# Patient Record
Sex: Male | Born: 1970 | Race: White | Hispanic: No | Marital: Married | Smoking: Never smoker
Health system: Southern US, Community
[De-identification: ages and names within clinical notes are randomized; demographics above are authoritative.]

## PROBLEM LIST (undated history)

## (undated) DIAGNOSIS — G4730 Sleep apnea, unspecified: Secondary | ICD-10-CM

## (undated) DIAGNOSIS — R51 Headache: Secondary | ICD-10-CM

## (undated) DIAGNOSIS — F419 Anxiety disorder, unspecified: Secondary | ICD-10-CM

## (undated) DIAGNOSIS — R6889 Other general symptoms and signs: Secondary | ICD-10-CM

---

## 1998-10-31 HISTORY — PX: ELBOW ARTHROSCOPY: SUR87

## 2000-10-31 HISTORY — PX: CHOLECYSTECTOMY: SHX55

## 2013-06-04 DIAGNOSIS — M79606 Pain in leg, unspecified: Secondary | ICD-10-CM | POA: Insufficient documentation

## 2013-06-18 DIAGNOSIS — M5431 Sciatica, right side: Secondary | ICD-10-CM | POA: Insufficient documentation

## 2013-06-21 DIAGNOSIS — R29898 Other symptoms and signs involving the musculoskeletal system: Secondary | ICD-10-CM | POA: Insufficient documentation

## 2013-06-21 DIAGNOSIS — M5416 Radiculopathy, lumbar region: Secondary | ICD-10-CM | POA: Insufficient documentation

## 2013-06-28 DIAGNOSIS — M48061 Spinal stenosis, lumbar region without neurogenic claudication: Secondary | ICD-10-CM | POA: Insufficient documentation

## 2013-06-28 DIAGNOSIS — R270 Ataxia, unspecified: Secondary | ICD-10-CM | POA: Insufficient documentation

## 2013-07-04 DIAGNOSIS — M4802 Spinal stenosis, cervical region: Secondary | ICD-10-CM | POA: Insufficient documentation

## 2013-07-04 DIAGNOSIS — M4804 Spinal stenosis, thoracic region: Secondary | ICD-10-CM | POA: Insufficient documentation

## 2013-10-10 ENCOUNTER — Inpatient Hospital Stay (HOSPITAL_COMMUNITY): Admission: RE | Admit: 2013-10-10 | Payer: Self-pay | Source: Ambulatory Visit | Admitting: Neurological Surgery

## 2013-10-10 ENCOUNTER — Encounter (HOSPITAL_COMMUNITY): Admission: RE | Payer: Self-pay | Source: Ambulatory Visit

## 2013-10-10 SURGERY — LUMBAR LAMINECTOMY/DECOMPRESSION MICRODISCECTOMY 4 LEVEL
Anesthesia: General

## 2014-01-06 ENCOUNTER — Other Ambulatory Visit: Payer: Self-pay | Admitting: Neurological Surgery

## 2014-02-12 ENCOUNTER — Ambulatory Visit (HOSPITAL_COMMUNITY)
Admission: RE | Admit: 2014-02-12 | Discharge: 2014-02-12 | Disposition: A | Discharge status: Home / Self Care | Payer: Worker's Compensation | Source: Ambulatory Visit | Attending: Neurological Surgery | Admitting: Neurological Surgery

## 2014-02-12 ENCOUNTER — Encounter (HOSPITAL_COMMUNITY)
Admission: RE | Admit: 2014-02-12 | Discharge: 2014-02-12 | Disposition: A | Discharge status: Home / Self Care | Payer: Worker's Compensation | Source: Ambulatory Visit | Attending: Neurological Surgery | Admitting: Neurological Surgery

## 2014-02-12 ENCOUNTER — Encounter (HOSPITAL_COMMUNITY): Payer: Self-pay

## 2014-02-12 ENCOUNTER — Encounter (HOSPITAL_COMMUNITY): Payer: Self-pay | Admitting: Pharmacy Technician

## 2014-02-12 DIAGNOSIS — Z01818 Encounter for other preprocedural examination: Secondary | ICD-10-CM | POA: Diagnosis present

## 2014-02-12 DIAGNOSIS — Z01810 Encounter for preprocedural cardiovascular examination: Secondary | ICD-10-CM | POA: Insufficient documentation

## 2014-02-12 LAB — CBC WITH DIFFERENTIAL/PLATELET
Basophils Absolute: 0 10*3/uL (ref 0.0–0.1)
Basophils Relative: 0 % (ref 0–1)
Eosinophils Absolute: 0.2 10*3/uL (ref 0.0–0.7)
Eosinophils Relative: 1 % (ref 0–5)
HCT: 47.2 % (ref 39.0–52.0)
HEMOGLOBIN: 16.5 g/dL (ref 13.0–17.0)
LYMPHS ABS: 4.1 10*3/uL — AB (ref 0.7–4.0)
Lymphocytes Relative: 30 % (ref 12–46)
MCH: 31.9 pg (ref 26.0–34.0)
MCHC: 35 g/dL (ref 30.0–36.0)
MCV: 91.3 fL (ref 78.0–100.0)
MONOS PCT: 7 % (ref 3–12)
Monocytes Absolute: 0.9 10*3/uL (ref 0.1–1.0)
NEUTROS ABS: 8.5 10*3/uL — AB (ref 1.7–7.7)
Neutrophils Relative %: 62 % (ref 43–77)
Platelets: 228 10*3/uL (ref 150–400)
RBC: 5.17 MIL/uL (ref 4.22–5.81)
RDW: 12.8 % (ref 11.5–15.5)
WBC: 13.8 10*3/uL — ABNORMAL HIGH (ref 4.0–10.5)

## 2014-02-12 LAB — BASIC METABOLIC PANEL
BUN: 13 mg/dL (ref 6–23)
CO2: 22 meq/L (ref 19–32)
Calcium: 9.6 mg/dL (ref 8.4–10.5)
Chloride: 103 mEq/L (ref 96–112)
Creatinine, Ser: 0.85 mg/dL (ref 0.50–1.35)
GFR calc Af Amer: >90 mL/min (ref >90)
GLUCOSE: 94 mg/dL (ref 70–99)
POTASSIUM: 4.2 meq/L (ref 3.7–5.3)
SODIUM: 140 meq/L (ref 137–147)

## 2014-02-12 LAB — PROTIME-INR
INR: 0.98 (ref 0.00–1.49)
Prothrombin Time: 12.8 seconds (ref 11.6–15.2)

## 2014-02-12 LAB — SURGICAL PCR SCREEN
MRSA, PCR: NEGATIVE
STAPHYLOCOCCUS AUREUS: NEGATIVE

## 2014-02-12 NOTE — Progress Notes (Signed)
02/12/14 1343  OBSTRUCTIVE SLEEP APNEA  Have you ever been diagnosed with sleep apnea through a sleep study? No  Do you snore loudly (loud enough to be heard through closed doors)?  1  Do you often feel tired, fatigued, or sleepy during the daytime? 1  Has anyone observed you stop breathing during your sleep? 1  Do you have, or are you being treated for high blood pressure? 0  BMI more than 35 kg/m2? 1  Age over 42 years old? 0  Neck circumference greater than 40 cm/18 inches? 1  Gender: 1  Obstructive Sleep Apnea Score 6  Score 4 or greater  Results sent to PCP

## 2014-02-12 NOTE — Pre-Procedure Instructions (Signed)
Raymond [name]  02/12/2014   Your procedure is scheduled on:  02/20/2014  [facility]  2 * 3  Entrance A, proceed to admitting office at 5:30 AM.  Call this number if you have problems the morning of surgery: [phone]   Remember:   Do not eat food or drink liquids after midnight. On Wednesday EVENING  Take these medicines the morning of surgery with A SIP OF WATER: NONE   Do not wear jewelry  Do not wear lotions, powders, or perfumes. You may wear deodorant.   Men may shave face and neck.  Do not bring valuables to the hospital.  [facility] not responsible                  for any belongings or valuables.               Contacts, dentures or bridgework may not be worn into surgery.  Leave suitcase in the car. After surgery it may be brought to your room.  For patients admitted to the hospital, discharge time is determined by your                treatment team.               Patients discharged the day of surgery will not be allowed to drive  home.  Name and phone number of your driver: with wife  Special Instructions: Special Instructions: [facility] - Preparing for Surgery  Before surgery, you can play an important role.  Because skin is not sterile, your skin needs to be as free of germs as possible.  You can reduce the number of germs on you skin by washing with CHG (chlorahexidine gluconate) soap before surgery.  CHG is an antiseptic cleaner which kills germs and bonds with the skin to continue killing germs even after washing.  Please DO NOT use if you have an allergy to CHG or antibacterial soaps.  If your skin becomes reddened/irritated stop using the CHG and inform your nurse when you arrive at Short Stay.  Do not shave (including legs and underarms) for at least 48 hours prior to the first CHG shower.  You may shave your face.  Please follow these instructions carefully:   1.  Shower with CHG Soap the night before surgery and the   morning of Surgery.  2.  If you choose to wash your hair, wash your hair first as usual with your  normal shampoo.  3.  After you shampoo, rinse your hair and body thoroughly to remove the  Shampoo.  4.  Use CHG as you would any other liquid soap.  You can apply chg directly to the skin and wash gently with scrungie or a clean washcloth.  5.  Apply the CHG Soap to your body ONLY FROM THE NECK DOWN.    Do not use on open wounds or open sores.  Avoid contact with your eyes, ears, mouth and genitals (private parts).  Wash genitals (private parts)   with your normal soap.  6.  Wash thoroughly, paying special attention to the area where your surgery will be performed.  7.  Thoroughly rinse your body with warm water from the neck down.  8.  DO NOT shower/wash with your normal soap after using and rinsing off   the CHG Soap.  9.  Pat yourself dry with a clean towel.  10.  Wear clean pajamas.            11.  Place clean sheets on your bed the night of your first shower and do not sleep with pets.  Day of Surgery  Do not apply any lotions/deodorants the morning of surgery.  Please wear clean clothes to the hospital/surgery center.   Please read over the following fact sheets that you were given: Pain Booklet, Coughing and Deep Breathing, MRSA Information and Surgical Site Infection Prevention

## 2014-02-12 NOTE — Progress Notes (Signed)
Pt. Recalls a possible EKG in past, unsure where it was done.  Pt. Denies ever seeing Pulm. Or cardiac specialty

## 2014-02-19 MED ORDER — DEXTROSE 5 % IV SOLN
3.0000 g | Freq: Once | INTRAVENOUS | Status: AC
  Administered 2014-02-20: 3 g via INTRAVENOUS
  Filled 2014-02-19: qty 3000

## 2014-02-19 MED ORDER — DEXAMETHASONE SODIUM PHOSPHATE 10 MG/ML IJ SOLN
10.0000 mg | INTRAMUSCULAR | Status: AC
  Administered 2014-02-20: 10 mg via INTRAVENOUS
  Filled 2014-02-19: qty 1

## 2014-02-19 MED ORDER — CEFAZOLIN SODIUM-DEXTROSE 2-3 GM-% IV SOLR: 2.0000 g | INTRAVENOUS | Status: DC

## 2014-02-20 ENCOUNTER — Ambulatory Visit (HOSPITAL_COMMUNITY)
Admission: RE | Admit: 2014-02-20 | Discharge: 2014-02-21 | Disposition: A | Discharge status: Home / Self Care | Payer: Worker's Compensation | Source: Ambulatory Visit | Attending: Neurological Surgery | Admitting: Neurological Surgery

## 2014-02-20 ENCOUNTER — Encounter (HOSPITAL_COMMUNITY): Payer: Self-pay | Admitting: *Deleted

## 2014-02-20 ENCOUNTER — Inpatient Hospital Stay (HOSPITAL_COMMUNITY): Payer: Worker's Compensation | Admitting: Anesthesiology

## 2014-02-20 ENCOUNTER — Encounter (HOSPITAL_COMMUNITY)
Admission: RE | Disposition: A | Discharge status: Home / Self Care | Payer: Self-pay | Source: Ambulatory Visit | Attending: Neurological Surgery

## 2014-02-20 ENCOUNTER — Inpatient Hospital Stay (HOSPITAL_COMMUNITY): Payer: Worker's Compensation

## 2014-02-20 ENCOUNTER — Encounter (HOSPITAL_COMMUNITY): Payer: Worker's Compensation | Admitting: Anesthesiology

## 2014-02-20 DIAGNOSIS — F411 Generalized anxiety disorder: Secondary | ICD-10-CM | POA: Insufficient documentation

## 2014-02-20 DIAGNOSIS — Z9889 Other specified postprocedural states: Secondary | ICD-10-CM

## 2014-02-20 DIAGNOSIS — X58XXXA Exposure to other specified factors, initial encounter: Secondary | ICD-10-CM | POA: Insufficient documentation

## 2014-02-20 DIAGNOSIS — Y990 Civilian activity done for income or pay: Secondary | ICD-10-CM | POA: Insufficient documentation

## 2014-02-20 DIAGNOSIS — M216X9 Other acquired deformities of unspecified foot: Secondary | ICD-10-CM | POA: Insufficient documentation

## 2014-02-20 DIAGNOSIS — S33101A Dislocation of unspecified lumbar vertebra, initial encounter: Principal | ICD-10-CM | POA: Insufficient documentation

## 2014-02-20 HISTORY — DX: Anxiety disorder, unspecified: F41.9

## 2014-02-20 HISTORY — PX: LUMBAR LAMINECTOMY/DECOMPRESSION MICRODISCECTOMY: SHX5026

## 2014-02-20 HISTORY — DX: Headache: R51

## 2014-02-20 HISTORY — DX: Other general symptoms and signs: R68.89

## 2014-02-20 SURGERY — LUMBAR LAMINECTOMY/DECOMPRESSION MICRODISCECTOMY 4 LEVEL
Anesthesia: General | Site: Back

## 2014-02-20 MED ORDER — OXYCODONE HCL 5 MG/5ML PO SOLN: 5.0000 mg | Freq: Once | ORAL | Status: DC | PRN

## 2014-02-20 MED ORDER — SODIUM CHLORIDE 0.9 % IJ SOLN: 3.0000 mL | INTRAMUSCULAR | Status: DC | PRN

## 2014-02-20 MED ORDER — HYDROMORPHONE HCL PF 1 MG/ML IJ SOLN: 0.2500 mg | INTRAMUSCULAR | Status: DC | PRN

## 2014-02-20 MED ORDER — MIDAZOLAM HCL 2 MG/2ML IJ SOLN
INTRAMUSCULAR | Status: AC
  Filled 2014-02-20: qty 2

## 2014-02-20 MED ORDER — PROPOFOL 10 MG/ML IV BOLUS
INTRAVENOUS | Status: AC
  Filled 2014-02-20: qty 20

## 2014-02-20 MED ORDER — ACETAMINOPHEN 650 MG RE SUPP: 650.0000 mg | RECTAL | Status: DC | PRN

## 2014-02-20 MED ORDER — NEOSTIGMINE METHYLSULFATE 1 MG/ML IJ SOLN
INTRAMUSCULAR | Status: AC
  Filled 2014-02-20: qty 10

## 2014-02-20 MED ORDER — 0.9 % SODIUM CHLORIDE (POUR BTL) OPTIME
TOPICAL | Status: DC | PRN
  Administered 2014-02-20: 1000 mL

## 2014-02-20 MED ORDER — PROPOFOL 10 MG/ML IV BOLUS
INTRAVENOUS | Status: DC | PRN
  Administered 2014-02-20: 300 mg via INTRAVENOUS

## 2014-02-20 MED ORDER — THROMBIN 5000 UNITS EX SOLR
CUTANEOUS | Status: DC | PRN
Start: 2014-02-20 — End: 2014-02-20
  Administered 2014-02-20: 09:00:00 via TOPICAL

## 2014-02-20 MED ORDER — BUPIVACAINE HCL (PF) 0.25 % IJ SOLN
INTRAMUSCULAR | Status: DC | PRN
  Administered 2014-02-20: 8 mL

## 2014-02-20 MED ORDER — GLYCOPYRROLATE 0.2 MG/ML IJ SOLN
INTRAMUSCULAR | Status: DC | PRN
  Administered 2014-02-20: .8 mg via INTRAVENOUS

## 2014-02-20 MED ORDER — FENTANYL CITRATE 0.05 MG/ML IJ SOLN
INTRAMUSCULAR | Status: DC | PRN
  Administered 2014-02-20 (×5): 50 ug via INTRAVENOUS

## 2014-02-20 MED ORDER — PROMETHAZINE HCL 25 MG/ML IJ SOLN: 6.2500 mg | INTRAMUSCULAR | Status: DC | PRN

## 2014-02-20 MED ORDER — METHOCARBAMOL 500 MG PO TABS
ORAL_TABLET | ORAL | Status: AC
  Filled 2014-02-20: qty 1

## 2014-02-20 MED ORDER — ACETAMINOPHEN 10 MG/ML IV SOLN
INTRAVENOUS | Status: AC
  Filled 2014-02-20: qty 100

## 2014-02-20 MED ORDER — OXYCODONE-ACETAMINOPHEN 5-325 MG PO TABS
1.0000 | ORAL_TABLET | ORAL | Status: DC | PRN
Start: 2014-02-20 — End: 2014-02-21
  Administered 2014-02-20 – 2014-02-21 (×4): 2 via ORAL
  Filled 2014-02-20 (×4): qty 2

## 2014-02-20 MED ORDER — HYDROMORPHONE HCL PF 1 MG/ML IJ SOLN
0.5000 mg | INTRAMUSCULAR | Status: DC | PRN
Start: 2014-02-20 — End: 2014-02-20
  Administered 2014-02-20 (×2): 0.5 mg via INTRAVENOUS

## 2014-02-20 MED ORDER — METHOCARBAMOL 100 MG/ML IJ SOLN
500.0000 mg | Freq: Four times a day (QID) | INTRAMUSCULAR | Status: DC | PRN
  Filled 2014-02-20: qty 5

## 2014-02-20 MED ORDER — NEOSTIGMINE METHYLSULFATE 1 MG/ML IJ SOLN
INTRAMUSCULAR | Status: DC | PRN
  Administered 2014-02-20: 5 mg via INTRAVENOUS

## 2014-02-20 MED ORDER — DEXAMETHASONE 4 MG PO TABS
4.0000 mg | ORAL_TABLET | Freq: Four times a day (QID) | ORAL | Status: DC
  Administered 2014-02-20 – 2014-02-21 (×4): 4 mg via ORAL
  Filled 2014-02-20 (×8): qty 1

## 2014-02-20 MED ORDER — SODIUM CHLORIDE 0.9 % IR SOLN
Status: DC | PRN
  Administered 2014-02-20: 09:00:00

## 2014-02-20 MED ORDER — OXYCODONE HCL 5 MG PO TABS: 5.0000 mg | ORAL_TABLET | Freq: Once | ORAL | Status: DC | PRN

## 2014-02-20 MED ORDER — HYDROMORPHONE HCL PF 1 MG/ML IJ SOLN
INTRAMUSCULAR | Status: AC
  Filled 2014-02-20: qty 1

## 2014-02-20 MED ORDER — SODIUM CHLORIDE 0.9 % IV SOLN: 250.0000 mL | INTRAVENOUS | Status: DC

## 2014-02-20 MED ORDER — FENTANYL CITRATE 0.05 MG/ML IJ SOLN
INTRAMUSCULAR | Status: AC
  Filled 2014-02-20: qty 5

## 2014-02-20 MED ORDER — THROMBIN 20000 UNITS EX SOLR
CUTANEOUS | Status: DC | PRN
Start: 2014-02-20 — End: 2014-02-20
  Administered 2014-02-20: 09:00:00 via TOPICAL

## 2014-02-20 MED ORDER — VANCOMYCIN HCL 1000 MG IV SOLR
INTRAVENOUS | Status: DC | PRN
  Administered 2014-02-20: 2 g via TOPICAL

## 2014-02-20 MED ORDER — MORPHINE SULFATE 2 MG/ML IJ SOLN: 1.0000 mg | INTRAMUSCULAR | Status: DC | PRN

## 2014-02-20 MED ORDER — ARTIFICIAL TEARS OP OINT
TOPICAL_OINTMENT | OPHTHALMIC | Status: DC | PRN
  Administered 2014-02-20: 1 via OPHTHALMIC

## 2014-02-20 MED ORDER — LIDOCAINE HCL (CARDIAC) 20 MG/ML IV SOLN
INTRAVENOUS | Status: AC
  Filled 2014-02-20: qty 5

## 2014-02-20 MED ORDER — OXYCODONE HCL 5 MG PO TABS
5.0000 mg | ORAL_TABLET | Freq: Once | ORAL | Status: DC | PRN
Start: 2014-02-20 — End: 2014-02-20

## 2014-02-20 MED ORDER — ROCURONIUM BROMIDE 50 MG/5ML IV SOLN
INTRAVENOUS | Status: AC
  Filled 2014-02-20: qty 1

## 2014-02-20 MED ORDER — ONDANSETRON HCL 4 MG/2ML IJ SOLN
INTRAMUSCULAR | Status: DC | PRN
  Administered 2014-02-20: 4 mg via INTRAVENOUS

## 2014-02-20 MED ORDER — PHENOL 1.4 % MT LIQD: 1.0000 | OROMUCOSAL | Status: DC | PRN

## 2014-02-20 MED ORDER — HYDROMORPHONE HCL PF 1 MG/ML IJ SOLN
INTRAMUSCULAR | Status: AC
  Administered 2014-02-20: 0.5 mg
  Filled 2014-02-20: qty 1

## 2014-02-20 MED ORDER — MENTHOL 3 MG MT LOZG
1.0000 | LOZENGE | OROMUCOSAL | Status: DC | PRN
  Administered 2014-02-21: 3 mg via ORAL
  Filled 2014-02-20 (×2): qty 9

## 2014-02-20 MED ORDER — ACETAMINOPHEN 10 MG/ML IV SOLN
INTRAVENOUS | Status: DC | PRN
  Administered 2014-02-20: 1000 mg via INTRAVENOUS

## 2014-02-20 MED ORDER — METHOCARBAMOL 500 MG PO TABS
500.0000 mg | ORAL_TABLET | Freq: Four times a day (QID) | ORAL | Status: DC | PRN
  Administered 2014-02-20 – 2014-02-21 (×3): 500 mg via ORAL
  Filled 2014-02-20 (×2): qty 1

## 2014-02-20 MED ORDER — VANCOMYCIN HCL 1000 MG IV SOLR
INTRAVENOUS | Status: AC
  Filled 2014-02-20: qty 2000

## 2014-02-20 MED ORDER — CEFAZOLIN SODIUM 1-5 GM-% IV SOLN
1.0000 g | Freq: Three times a day (TID) | INTRAVENOUS | Status: AC
  Administered 2014-02-20 – 2014-02-21 (×2): 1 g via INTRAVENOUS
  Filled 2014-02-20 (×3): qty 50

## 2014-02-20 MED ORDER — LIDOCAINE HCL (CARDIAC) 20 MG/ML IV SOLN
INTRAVENOUS | Status: DC | PRN
  Administered 2014-02-20: 100 mg via INTRAVENOUS

## 2014-02-20 MED ORDER — POTASSIUM CHLORIDE IN NACL 20-0.9 MEQ/L-% IV SOLN
INTRAVENOUS | Status: DC
  Filled 2014-02-20 (×3): qty 1000

## 2014-02-20 MED ORDER — PHENYLEPHRINE HCL 10 MG/ML IJ SOLN
INTRAMUSCULAR | Status: DC | PRN
  Administered 2014-02-20 (×2): 40 ug via INTRAVENOUS

## 2014-02-20 MED ORDER — ONDANSETRON HCL 4 MG/2ML IJ SOLN
INTRAMUSCULAR | Status: AC
  Filled 2014-02-20: qty 2

## 2014-02-20 MED ORDER — ROCURONIUM BROMIDE 100 MG/10ML IV SOLN
INTRAVENOUS | Status: DC | PRN
  Administered 2014-02-20: 50 mg via INTRAVENOUS
  Administered 2014-02-20: 10 mg via INTRAVENOUS
  Administered 2014-02-20: 20 mg via INTRAVENOUS

## 2014-02-20 MED ORDER — ONDANSETRON HCL 4 MG/2ML IJ SOLN: 4.0000 mg | INTRAMUSCULAR | Status: DC | PRN

## 2014-02-20 MED ORDER — SODIUM CHLORIDE 0.9 % IJ SOLN
3.0000 mL | Freq: Two times a day (BID) | INTRAMUSCULAR | Status: DC
  Administered 2014-02-21: 3 mL via INTRAVENOUS

## 2014-02-20 MED ORDER — GLYCOPYRROLATE 0.2 MG/ML IJ SOLN
INTRAMUSCULAR | Status: AC
  Filled 2014-02-20: qty 4

## 2014-02-20 MED ORDER — ACETAMINOPHEN 325 MG PO TABS: 650.0000 mg | ORAL_TABLET | ORAL | Status: DC | PRN

## 2014-02-20 MED ORDER — LACTATED RINGERS IV SOLN
INTRAVENOUS | Status: DC | PRN
  Administered 2014-02-20 (×3): via INTRAVENOUS

## 2014-02-20 MED ORDER — DEXAMETHASONE SODIUM PHOSPHATE 4 MG/ML IJ SOLN
4.0000 mg | Freq: Four times a day (QID) | INTRAMUSCULAR | Status: DC
  Filled 2014-02-20 (×4): qty 1

## 2014-02-20 MED ORDER — MIDAZOLAM HCL 5 MG/5ML IJ SOLN
INTRAMUSCULAR | Status: DC | PRN
  Administered 2014-02-20: 2 mg via INTRAVENOUS

## 2014-02-20 SURGICAL SUPPLY — 56 items
BAG DECANTER FOR FLEXI CONT (MISCELLANEOUS) ×2 IMPLANT
BENZOIN TINCTURE PRP APPL 2/3 (GAUZE/BANDAGES/DRESSINGS) ×2 IMPLANT
BLADE 10 SAFETY STRL DISP (BLADE) ×2 IMPLANT
BUR MATCHSTICK NEURO 3.0 LAGG (BURR) ×2 IMPLANT
CANISTER SUCT 3000ML (MISCELLANEOUS) ×2 IMPLANT
CONT SPEC 4OZ CLIKSEAL STRL BL (MISCELLANEOUS) ×2 IMPLANT
DRAPE LAPAROTOMY 100X72X124 (DRAPES) ×2 IMPLANT
DRAPE MICROSCOPE ZEISS OPMI (DRAPES) ×2 IMPLANT
DRAPE POUCH INSTRU U-SHP 10X18 (DRAPES) ×2 IMPLANT
DRAPE SURG 17X23 STRL (DRAPES) ×2 IMPLANT
DRESSING TELFA 8X3 (GAUZE/BANDAGES/DRESSINGS) ×2 IMPLANT
DRSG OPSITE 4X5.5 SM (GAUZE/BANDAGES/DRESSINGS) ×2 IMPLANT
DRSG OPSITE POSTOP 4X8 (GAUZE/BANDAGES/DRESSINGS) ×2 IMPLANT
DURAPREP 26ML APPLICATOR (WOUND CARE) ×2 IMPLANT
ELECT REM PT RETURN 9FT ADLT (ELECTROSURGICAL) ×2
ELECTRODE REM PT RTRN 9FT ADLT (ELECTROSURGICAL) ×1 IMPLANT
EVACUATOR 1/8 PVC DRAIN (DRAIN) ×2 IMPLANT
GAUZE SPONGE 4X4 16PLY XRAY LF (GAUZE/BANDAGES/DRESSINGS) IMPLANT
GLOVE BIO SURGEON STRL SZ8 (GLOVE) ×4 IMPLANT
GLOVE BIO SURGEON STRL SZ8.5 (GLOVE) ×2 IMPLANT
GLOVE BIOGEL PI IND STRL 7.0 (GLOVE) ×2 IMPLANT
GLOVE BIOGEL PI IND STRL 7.5 (GLOVE) ×1 IMPLANT
GLOVE BIOGEL PI INDICATOR 7.0 (GLOVE) ×2
GLOVE BIOGEL PI INDICATOR 7.5 (GLOVE) ×1
GLOVE ECLIPSE 7.5 STRL STRAW (GLOVE) ×6 IMPLANT
GLOVE SS BIOGEL STRL SZ 6.5 (GLOVE) ×2 IMPLANT
GLOVE SS BIOGEL STRL SZ 8 (GLOVE) ×1 IMPLANT
GLOVE SUPERSENSE BIOGEL SZ 6.5 (GLOVE) ×2
GLOVE SUPERSENSE BIOGEL SZ 8 (GLOVE) ×1
GOWN BRE IMP SLV AUR LG STRL (GOWN DISPOSABLE) IMPLANT
GOWN BRE IMP SLV AUR XL STRL (GOWN DISPOSABLE) ×2 IMPLANT
GOWN STRL REIN 2XL LVL4 (GOWN DISPOSABLE) IMPLANT
GOWN STRL REUS W/ TWL LRG LVL3 (GOWN DISPOSABLE) ×1 IMPLANT
GOWN STRL REUS W/ TWL XL LVL3 (GOWN DISPOSABLE) ×4 IMPLANT
GOWN STRL REUS W/TWL LRG LVL3 (GOWN DISPOSABLE) ×1
GOWN STRL REUS W/TWL XL LVL3 (GOWN DISPOSABLE) ×4
HEMOSTAT POWDER KIT SURGIFOAM (HEMOSTASIS) IMPLANT
KIT BASIN OR (CUSTOM PROCEDURE TRAY) ×2 IMPLANT
KIT ROOM TURNOVER OR (KITS) ×2 IMPLANT
NEEDLE HYPO 25X1 1.5 SAFETY (NEEDLE) ×2 IMPLANT
NEEDLE SPNL 20GX3.5 QUINCKE YW (NEEDLE) IMPLANT
NS IRRIG 1000ML POUR BTL (IV SOLUTION) ×2 IMPLANT
PACK LAMINECTOMY NEURO (CUSTOM PROCEDURE TRAY) ×2 IMPLANT
PAD ARMBOARD 7.5X6 YLW CONV (MISCELLANEOUS) ×6 IMPLANT
RUBBERBAND STERILE (MISCELLANEOUS) ×4 IMPLANT
SPONGE SURGIFOAM ABS GEL SZ50 (HEMOSTASIS) ×2 IMPLANT
STRIP CLOSURE SKIN 1/2X4 (GAUZE/BANDAGES/DRESSINGS) ×2 IMPLANT
SUT VIC AB 0 CT1 18XCR BRD8 (SUTURE) ×1 IMPLANT
SUT VIC AB 0 CT1 8-18 (SUTURE) ×1
SUT VIC AB 2-0 CP2 18 (SUTURE) ×2 IMPLANT
SUT VIC AB 3-0 SH 8-18 (SUTURE) ×2 IMPLANT
SYR 20ML ECCENTRIC (SYRINGE) ×2 IMPLANT
TOWEL OR 17X24 6PK STRL BLUE (TOWEL DISPOSABLE) ×2 IMPLANT
TOWEL OR 17X26 10 PK STRL BLUE (TOWEL DISPOSABLE) ×2 IMPLANT
TRAY FOLEY CATH 16FRSI W/METER (SET/KITS/TRAYS/PACK) ×2 IMPLANT
WATER STERILE IRR 1000ML POUR (IV SOLUTION) ×2 IMPLANT

## 2014-02-20 NOTE — Anesthesia Postprocedure Evaluation (Signed)
  Anesthesia Post-op Note  Patient: Raymond [name]  Procedure(s) Performed: Procedure(s): LUMBAR TWO/THREE, LUMBAR FOUR/FIVE, LUMBAR FIVE SACRAL ONE LAMINECTOMY WITH RIGHT LUMBAR THREE/FOUR, LUMBAR FOUR/FIVE AND FIVE SACRAL ONE MICRODISECTOMY (N/A)  Patient Location: PACU  Anesthesia Type:General  Level of Consciousness: awake  Airway and Oxygen Therapy: Patient Spontanous Breathing  Post-op Pain: mild  Post-op Assessment: Post-op Vital signs reviewed, Patient's Cardiovascular Status Stable, Respiratory Function Stable, Patent Airway, No signs of Nausea or vomiting and Pain level controlled  Post-op Vital Signs: Reviewed and stable  Last Vitals:  Filed Vitals:   02/20/14 1230  BP:   Pulse:   Temp: 36.7 C  Resp:     Complications: No apparent anesthesia complications

## 2014-02-20 NOTE — Transfer of Care (Signed)
Immediate Anesthesia Transfer of Care Note  Patient: Raymond [name]  Procedure(s) Performed: Procedure(s): LUMBAR TWO/THREE, LUMBAR FOUR/FIVE, LUMBAR FIVE SACRAL ONE LAMINECTOMY WITH RIGHT LUMBAR THREE/FOUR, LUMBAR FOUR/FIVE AND FIVE SACRAL ONE MICRODISECTOMY (N/A)  Patient Location: PACU  Anesthesia Type:General  Level of Consciousness: awake, alert  and oriented  Airway & Oxygen Therapy: Patient Spontanous Breathing  Post-op Assessment: Report given to PACU RN  Post vital signs: Reviewed and stable  Complications: No apparent anesthesia complications

## 2014-02-20 NOTE — Consult Note (Signed)
Subjective: Patient is a 43 y.o. male admitted for DLL. Onset of symptoms was several months ago, pro worse since that time.  The pain is rated severe, and is located at the low back and radiates to legs R>L. The pain is described as aching and occurs all day. The symptoms have been progressive. Symptoms are exacerbated by walking. MRI or CT showed severe stenosis with HNP.  Past Medical History  Diagnosis Date  . Anxiety     panic attack related to pain & also related to decrease ability to breathe through nose    . Headache(784.0)     past 2 yrs. , migraines resolved   . Nasal airway abnormality     [place]- [place] [facility] [place] ability to breathe through nose, told that he has to get ENT consult     Past Surgical History  Procedure Laterality Date  . Elbow arthroscopy Right 2000    muscle repair   . Cholecystectomy  2002    Prior to Admission medications   Medication Sig Start Date End Date Taking? Authorizing Provider  cetirizine (ZYRTEC) 10 MG tablet Take 10 mg by mouth daily as needed for allergies.    Yes Historical Provider, MD  ibuprofen (ADVIL,MOTRIN) 200 MG tablet Take 800 mg by mouth every 6 (six) hours as needed for moderate pain.    Yes Historical Provider, MD   No Known Allergies  History  Substance Use Topics  . Smoking status: Never Smoker   . Smokeless tobacco: Not on file  . Alcohol Use: No    History reviewed. No pertinent family history.   Review of Systems  Positive ROS: neg  All other systems have been reviewed and were otherwise negative with the exception of those mentioned in the HPI and as above.  Objective: Vital signs in last 24 hours: Temp:  [97.6 F (36.4 C)] 97.6 F (36.4 C) (04/23 0606) Pulse Rate:  [72] 72 (04/23 0602) Resp:  [20] 20 (04/23 0602) BP: (140)/(96) 140/96 mmHg (04/23 0602) SpO2:  [97 %] 97 % (04/23 0602)  General Appearance: Raymond [name], cooperative, no distress, appears stated age Head: Normocephalic, without  obvious abnormality, atraumatic Eyes: PERRL, conjunctiva/corneas clear, EOM's intact    Neck: Supple, symmetrical, trachea midline Back: Symmetric, no curvature, ROM normal, no CVA tenderness Lungs:  respirations unlabored Heart: Regular rate and rhythm Abdomen: Soft, non-tender Extremities: Extremities normal, atraumatic, no cyanosis or edema Pulses: 2+ and symmetric all extremities Skin: Skin color, texture, turgor normal, no rashes or lesions  NEUROLOGIC:   Mental status: Raymond [name] and oriented x4,  no aphasia, good attention span, fund of knowledge, and memory Motor Exam - grossly normal except weakness DF, KE HF R, 4-/5 Sensory Exam - grossly normal Reflexes: decreased Coordination - grossly normal Gait - limps R Balance - grossly normal Cranial Nerves: I: smell Not tested  II: visual acuity  OS: nl    OD: nl  II: visual fields Full to confrontation  II: pupils Equal, round, reactive to light  III,VII: ptosis None  III,IV,VI: extraocular muscles  Full ROM  V: mastication Normal  V: facial light touch sensation  Normal  V,VII: corneal reflex  Present  VII: facial muscle function - upper  Normal  VII: facial muscle function - lower Normal  VIII: hearing Not tested  IX: soft palate elevation  Normal  IX,X: gag reflex Present  XI: trapezius strength  5/5  XI: sternocleidomastoid strength 5/5  XI: neck flexion strength  5/5  XII: tongue strength  Normal    Data Review Lab Results  Component Value Date   WBC 13.8* 02/12/2014   HGB 16.5 02/12/2014   HCT 47.2 02/12/2014   MCV 91.3 02/12/2014   PLT 228 02/12/2014   Lab Results  Component Value Date   NA 140 02/12/2014   K 4.2 02/12/2014   CL 103 02/12/2014   CO2 22 02/12/2014   BUN 13 02/12/2014   CREATININE 0.85 02/12/2014   GLUCOSE 94 02/12/2014   Lab Results  Component Value Date   INR 0.98 02/12/2014    Assessment/Plan: Patient admitted for DLL L2-L5 with microdiskectomy. Patient has failed a reasonable attempt at  conservative therapy.  I explained the condition and procedure to the patient and answered any questions.  Patient wishes to proceed with procedure as planned. Understands risks/ benefits and typical outcomes of procedure.   Raymond Raymond Raymond [name] 02/20/2014 6:54 AM

## 2014-02-20 NOTE — Evaluation (Signed)
Physical Therapy Evaluation and discharge  Patient Details Name: Raymond [name] MRN: [id number] DOB: Jun 09, 1971 Today's Date: 02/20/2014   History of Present Illness  s/p DLL L2-L5 with microdiskectomy  Clinical Impression  Pt s/p surgery listed above. Pt seen for evaluation. Educated on back precautions, log rolling and stair management technique. Pt demo good carryover and knowledge of education. Pt at supervision level for all mobility at this time. Pt to benefit from acute OT to address questions regarding ADLs. At this time no further acute PT needs warranted at this time. Pt encouraged to ambulate with nurses around unit as tolerated.     Follow Up Recommendations No PT follow up;Supervision for mobility/OOB    Equipment Recommendations  None recommended by PT    Recommendations for Other Services OT consult     Precautions / Restrictions Precautions Precautions: Back Precaution Booklet Issued: Yes (comment) Precaution Comments: given handout and educated on back precautions throughout session Restrictions Weight Bearing Restrictions: No      Mobility  Bed Mobility Overal bed mobility: Needs Assistance Bed Mobility: Rolling;Sidelying to Sit Rolling: Supervision Sidelying to sit: Supervision       General bed mobility comments: supervision only for cues for proper log rolling technique; pt able to log roll without physical (A)  Transfers Overall transfer level: Needs assistance Equipment used: None Transfers: Sit to/from Stand Sit to Stand: Supervision         General transfer comment: supervision only for cues to adhere to back precautions; pt able to perform proper technique with incr time due to pain  Ambulation/Gait Ambulation/Gait assistance: Supervision Ambulation Distance (Feet): 200 Feet Assistive device: None Gait Pattern/deviations: Step-through pattern;Decreased stride length;Wide base of support (ER LEs) Gait velocity: decreased and guarded  initially; increasing to [facility] at end of session  Gait velocity interpretation: at or above normal speed for age/gender    Stairs Stairs: Yes Stairs assistance: Supervision Stair Management: One rail Right;Step to pattern;Forwards;Sideways Number of Stairs: 4 General stair comments: forwards technique for stair negotiation going up; sideways technique going down steps; supervision for safety and cues for proper technique   Wheelchair Mobility    Modified Rankin (Stroke Patients Only)       Balance Overall balance assessment: No apparent balance deficits (not formally assessed)                                           Pertinent Vitals/Pain 3/10 at incision area. Premedicated.     Home Living Family/patient expects to be discharged to:: Private residence Living Arrangements: Spouse/significant other Available Help at Discharge: Family;Available 24 hours/day Type of Home: Mobile home Home Access: Stairs to enter Entrance Stairs-Rails: Can reach both;Left;Right Entrance Stairs-Number of Steps: 4 Home Layout: One level Home Equipment:  (walking stick )      Prior Function Level of Independence: Independent               Hand Dominance        Extremity/Trunk Assessment   Upper Extremity Assessment: Defer to OT evaluation           Lower Extremity Assessment: RLE deficits/detail RLE Deficits / Details: c/o Rt LE numbness prior to admission; reports today; "it feels much better"     Cervical / Trunk Assessment: Kyphotic  Communication   Communication: No difficulties  Cognition Arousal/Alertness: Awake/alert Behavior During Therapy: WFL for tasks assessed/performed Overall  Cognitive Status: Within Functional Limits for tasks assessed                      General Comments General comments (skin integrity, edema, etc.): pt with questions regarding ADLs     Exercises        Assessment/Plan    PT Assessment Patent does  not need any further PT services  PT Diagnosis     PT Problem List    PT Treatment Interventions     PT Goals (Current goals can be found in the Care Plan section) Acute Rehab PT Goals Patient Stated Goal: to go home today PT Goal Formulation: No goals set, d/c therapy    Frequency     Barriers to discharge        Co-evaluation               End of Session Equipment Utilized During Treatment: Gait belt Activity Tolerance: Patient tolerated treatment well Patient left: in bed;Other (comment);with call bell/phone within reach (sitting EOB) Nurse Communication: Mobility status;Precautions    Functional Assessment Tool Used: clinical judgement  Functional Limitation: Mobility: Walking and moving around Mobility: Walking and Moving Around Current Status ([id number](G8978): At least 1 percent but less than 20 percent impaired, limited or restricted Mobility: Walking and Moving Around Goal Status [phone](G8979): 0 percent impaired, limited or restricted Mobility: Walking and Moving Around Discharge Status [phone](G8980): At least 1 percent but less than 20 percent impaired, limited or restricted    Time: [id number] PT Time Calculation (min): 22 min   Charges:   PT Evaluation $Initial PT Evaluation Tier I: 1 Procedure PT Treatments $Gait Training: 8-22 mins   PT G Codes:   Functional Assessment Tool Used: clinical judgement  Functional Limitation: Mobility: Walking and moving around    RozelBrittany N Niyati Heinke, [place] CarolinaPT [phone] 02/20/2014, 4:47 PM

## 2014-02-20 NOTE — Anesthesia Preprocedure Evaluation (Addendum)
Anesthesia Evaluation  Patient identified by MRN, date of birth, ID band Patient awake    Reviewed: Allergy & Precautions, H&P , NPO status , Patient's Chart, lab work & pertinent test results  Airway Mallampati: II TM Distance: >3 FB Neck ROM: Full    Dental   Pulmonary  breath sounds clear to auscultation        Cardiovascular Rhythm:Regular Rate:Normal     Neuro/Psych  Headaches,    GI/Hepatic   Endo/Other  Morbid obesity  Renal/GU      Musculoskeletal   Abdominal (+) + obese,   Peds  Hematology   Anesthesia Other Findings   Reproductive/Obstetrics                           Anesthesia Physical Anesthesia Plan  ASA: II  Anesthesia Plan: General   Post-op Pain Management:    Induction: Intravenous  Airway Management Planned: Oral ETT  Additional Equipment:   Intra-op Plan:   Post-operative Plan: Extubation in OR  Informed Consent: I have reviewed the patients History and Physical, chart, labs and discussed the procedure including the risks, benefits and alternatives for the proposed anesthesia with the patient or authorized representative who has indicated his/her understanding and acceptance.   Dental advisory given  Plan Discussed with: CRNA and Surgeon  Anesthesia Plan Comments:        Anesthesia Quick Evaluation

## 2014-02-20 NOTE — Op Note (Signed)
02/20/2014  11:18 AM  PATIENT:  Raymond [name]  43 y.o. 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 Decompressive lumbar hemilaminectomy, medial facetectomy, and foraminotomies L2-3 on the right followed by sublaminar decompression, 2. Bilateral decompressive lumbar hemilaminectomy, medial facetectomy, and foraminotomies L3-4 followed by microdiscectomy L3-4 on the right utilizing my scope of dissection, 3. Left L4-5 hemilaminectomy, medial facetectomy, and foraminotomies followed by microdiscectomy L4-5 on the left, 4. Right L5-S1 hemilaminectomy, medial facetectomy, and foraminotomies followed by microdiscectomy L5-S1 on the right utilizing microscopic dissection  SURGEON:  Marikay Alaravid Jackob [place], MD  ASSISTANTS: Dr. Lovell SheehanJenkins  ANESTHESIA:   General  EBL: 150 ml  Total I/O In: 2000 [I.V.:2000] Out: 520 [Urine:370; Blood:150]  BLOOD ADMINISTERED:none  DRAINS: Medium Hemovac   SPECIMEN:  No Specimen  INDICATION FOR PROCEDURE: This patient had a work-related injury and presented with severe back and right leg pain. Over time he developed a right foot drop. MRI and CT myelogram showed significant spinal stenosis L2-L3 4 L4-5 and L5-S1. Recommended multilevel decompressive laminectomy and microdiscectomy. Patient understood the risks, benefits, and alternatives and potential outcomes and wished to proceed.  PROCEDURE DETAILS: The patient was taken to the operating room and after induction of adequate generalized endotracheal anesthesia, the patient was rolled into the prone position on the Wilson frame and all pressure points were padded. The lumbar region was cleaned and then prepped with DuraPrep and draped in  the usual sterile fashion. 5 cc of local anesthesia was injected and then a dorsal midline incision was made and carried down to the lumbo sacral fascia. The fascia was opened and the paraspinous musculature was taken down in a subperiosteal fashion to expose L2-3 bilaterally, L3-4 bilaterally, L4-5 bilaterally, and L5-S1 on the right. Intraoperative x-ray confirmed my level, and then I used a combination of the high-speed drill and the Kerrison punches to perform a hemilaminectomy, medial facetectomy, and foraminotomy at L4-5 on the left, L5-S1 on the right, and bilaterally at L3-4, and on the right at L2-3. The underlying yellow ligament was opened and removed in a piecemeal fashion to expose the underlying dura and exiting nerve root at each laminectomy level. I undercut the lateral recess and dissected down until I was medial to and distal to the pedicle at each level. The nerve root was well decompressed. At L2-3-1 performed a sublaminar decompression to decompress the central canal and the left lateral recess. We then gently retracted the nerve root medially with a retractor at L5-S1 on the right, L4-5 on the left, and L3-4 on the right coagulated the epidural venous vasculature, and incised the disc space. I performed a thorough intradiscal discectomy with pituitary rongeurs and curettes, until I had a nice decompression of the nerve root and the midline at each level. I then palpated with a coronary dilator along the nerve root and into the foramen to assure adequate decompression. I felt no more compression of the nerve root at any of the 3 levels. I irrigated with saline solution containing bacitracin. Achieved hemostasis with bipolar cautery, lined the dura with Gelfoam, and then closed the fascia with 0 Vicryl. Then sprinkled 2 g of vancomycin powder into the wound. I closed the subcutaneous tissues with 2-0 Vicryl and the subcuticular tissues with 3-0 Vicryl. The  skin was then closed with benzoin and  Steri-Strips. The drapes were removed, a sterile dressing was applied. The patient was awakened from general anesthesia and transferred to the recovery room in stable condition. At the end of the procedure all sponge, needle and instrument counts were correct.   PLAN OF CARE: Admit for overnight observation  PATIENT DISPOSITION:  PACU - hemodynamically stable.   Delay start of Pharmacological VTE agent (>24hrs) due to surgical blood loss or risk of bleeding:  yes

## 2014-02-21 MED ORDER — OXYCODONE-ACETAMINOPHEN 5-325 MG PO TABS: 1.0000 | ORAL_TABLET | ORAL | Status: DC | PRN

## 2014-02-21 MED ORDER — METHOCARBAMOL 500 MG PO TABS: 500.0000 mg | ORAL_TABLET | Freq: Four times a day (QID) | ORAL | Status: DC | PRN

## 2014-02-21 NOTE — Discharge Summary (Signed)
Physician Discharge Summary  Patient ID: Raymond [name] E Purington MRN: [id number] DOB/AGE: [birth year] 43 y.o.  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 The patient tolerated the procedure well and was taken to the recovery room and then to the floor in stable condition. The hospital course was routine. There were no complications. The wound remained clean dry and intact. Pt had appropriate back soreness. No complaints of leg pain or new N/T/W. he was quite active and felt much better than before surgery and was very thankful. The patient remained afebrile with stable vital signs, and tolerated a regular diet. The patient continued to increase activities, and pain was well controlled with oral pain medications.   Consults: None  Significant Diagnostic Studies:  Results for orders placed during the hospital encounter of 02/20/14  BASIC METABOLIC PANEL      Result Value Ref Range   Sodium 140  137 - 147 mEq/L   Potassium 4.2  3.7 - 5.3 mEq/L   Chloride 103  96 - 112 mEq/L   CO2 22  19 - 32 mEq/L   Glucose, Bld 94  70 - 99 mg/dL   BUN 13  6 - 23 mg/dL   Creatinine, Ser 9.140.85  0.50 - 1.35 mg/dL   Calcium 9.6  8.4 - 78.210.5 mg/dL   GFR calc non Af Amer >90  >90 mL/min   GFR calc Af Amer >90  >90 mL/min  CBC WITH DIFFERENTIAL      Result Value Ref Range   WBC 13.8 (*) 4.0 - 10.5 K/uL   RBC 5.17  4.22 - 5.81 MIL/uL   Hemoglobin 16.5  13.0 - 17.0 g/dL   HCT 95.647.2  21.339.0 - 08.652.0 %   MCV 91.3  78.0 - 100.0 fL   MCH 31.9  26.0 - 34.0 pg   MCHC 35.0  30.0 - 36.0 g/dL   RDW 57.812.8  46.911.5 - 62.915.5 %   Platelets 228  150 - 400 K/uL   Neutrophils Relative % 62  43 - 77 %   Neutro Abs 8.5 (*) 1.7 - 7.7 K/uL   Lymphocytes Relative 30  12 - 46 %   Lymphs Abs 4.1 (*) 0.7 - 4.0 K/uL   Monocytes Relative 7  3 - 12 %   Monocytes Absolute 0.9  0.1 - 1.0 K/uL   Eosinophils Relative 1  0 - 5 %   Eosinophils Absolute 0.2  0.0 - 0.7 K/uL   Basophils Relative 0  0 - 1 %   Basophils Absolute 0.0  0.0 - 0.1 K/uL  PROTIME-INR      Result Value Ref Range   Prothrombin Time 12.8  11.6 - 15.2 seconds   INR 0.98  0.00 - 1.49    Chest 2 View  02/12/2014   CLINICAL DATA:  Preop for lumbar surgery, nonsmoker  EXAM: CHEST  2 VIEW  COMPARISON:  None.  FINDINGS: The heart size and mediastinal contours are within normal limits. Both lungs are clear. The visualized skeletal structures are unremarkable.  IMPRESSION: No active cardiopulmonary disease.   Electronically Signed   By: Esperanza Heiraymond  Rubner M.D.   On: 02/12/2014 14:32   Dg Lumbar Spine 1 View  02/20/2014   CLINICAL DATA:  L2-S1 laminectomy  EXAM: LUMBAR SPINE - 1 VIEW  COMPARISON:  MR OUTSIDE FILMS SPINE dated 06/27/2013; CT OUTSIDE FILMS SPINE dated 07/03/2013  FINDINGS: A single spot lateral intraoperative radiographic image of the lower lumbar spine is provided for review.  Spinal labeling is in keeping with preprocedural lumbar spine CT.  A marking instrument overlies the soft tissues posterior to the L4-L5 intervertebral disc space.  Additional radiopaque surgical support apparatus is seen posterior to the operative site.  IMPRESSION: Intraoperative localization of L4-L5.   Electronically Signed   By: Simonne ComeJohn  Watts M.D.   On: 02/20/2014 09:24    Antibiotics:  Anti-infectives   Start     Dose/Rate Route Frequency Ordered Stop   02/20/14 1600  ceFAZolin (ANCEF) IVPB 1 g/50 mL premix     1 g 100 mL/hr over 30 Minutes Intravenous Every 8 hours 02/20/14 1130 02/21/14 0042   02/20/14 1037  vancomycin (VANCOCIN) powder  Status:  Discontinued       As needed 02/20/14 1038 02/20/14 1115   02/20/14 0937  vancomycin (VANCOCIN) 1000 MG powder    Comments:  Dorinda HillKey, Jennifer   :  cabinet override      02/20/14 0937 02/20/14 2144   02/20/14 0851  bacitracin 50,000 Units in sodium chloride irrigation 0.9 % 500 mL irrigation  Status:  Discontinued       As needed 02/20/14 0852 02/20/14 1115   02/20/14 0600  ceFAZolin (ANCEF) IVPB 2 g/50 mL premix  Status:  Discontinued     2 g 100 mL/hr over 30 Minutes Intravenous On call to O.R. 02/19/14 1418 02/19/14 1418   02/19/14 1430  ceFAZolin (ANCEF) 3 g in dextrose 5 % 50 mL IVPB     3 g 160 mL/hr over 30 Minutes Intravenous  Once 02/19/14 1419 02/20/14 0748      Discharge Exam: Blood pressure 114/74, pulse 107, temperature 99 F (37.2 C), temperature source Oral, resp. rate 20, SpO2 93.00%. Neurologic: Grossly normal with normal lower extremity strength and sensation, improved from preop Incision clean dry and intact  Discharge Medications:     Medication List         cetirizine 10 MG tablet  Commonly known as:  ZYRTEC  Take 10 mg by mouth daily as needed for allergies.     ibuprofen 200 MG tablet  Commonly known as:  ADVIL,MOTRIN  Take 800 mg by mouth every 6 (six) hours as needed for moderate pain.     methocarbamol 500 MG tablet  Commonly known as:  ROBAXIN  Take 1 tablet (500 mg total) by mouth every 6 (six) hours as needed for muscle spasms.     oxyCODONE-acetaminophen 5-325 MG per tablet  Commonly known as:  PERCOCET/ROXICET  Take 1-2 tablets by mouth every 4 (four) hours as needed for moderate pain.        Disposition: Home   Final Dx: Lumbar laminectomy and discectomy L2-3 L3-4 L4-5 L5-S1      Discharge Orders   Future Orders Complete By Expires   Call MD for:  difficulty breathing, headache or visual disturbances  As directed    Call MD for:  persistant nausea and vomiting  As directed    Call MD for:  redness, tenderness, or signs of infection (pain, swelling, redness, odor or green/yellow discharge around incision site)  As directed    Call MD for:  severe uncontrolled pain  As directed     Call MD for:  temperature >100.4  As directed    Diet -  low sodium heart healthy  As directed    Discharge instructions  As directed    Increase activity slowly  As directed       Follow-up Information   Follow up with Shellie Goettl S, MD In 2 weeks.   Specialty:  Neurosurgery   Contact information:   [address] [place] [address] [phone]        Signed: Tia Alert 02/21/2014, 9:02 AM

## 2014-02-21 NOTE — Progress Notes (Signed)
Pt. Alert and oriented, follows simple instructions, denies pain. Incision area without swelling, redness or S/S of infection. Voiding adequate clear yellow urine. Moving all extremities well and vitals stable and documented. Patient discharged home with family. Lumbar surgery notes instructions and dressing supplies given to patient and family member for home safety and precautions. Pt. and family stated understanding of instructions given.

## 2014-02-25 ENCOUNTER — Encounter (HOSPITAL_COMMUNITY): Payer: Self-pay | Admitting: Neurological Surgery

## 2014-09-10 ENCOUNTER — Other Ambulatory Visit: Payer: Self-pay | Admitting: Neurological Surgery

## 2014-10-09 NOTE — Pre-Procedure Instructions (Signed)
Guy SandiferJames E Addair  10/09/2014   Your procedure is scheduled on:  10/15/14  [facility] Admitting at 630 AM.  Call this number if you have problems the morning of surgery: [phone]   Remember:   Do not eat food or drink liquids after midnight.   Take these medicines the morning of surgery with A SIP OF WATER: zyrtec,oxycodone,robaxin   Do not wear jewelry, make-up or nail polish.  Do not wear lotions, powders, or perfumes. You may wear deodorant.  Do not shave 48 hours prior to surgery. Men may shave face and neck.  Do not bring valuables to the hospital.  [facility] is not responsible                  for any belongings or valuables.               Contacts, dentures or bridgework may not be worn into surgery.  Leave suitcase in the car. After surgery it may be brought to your room.  For patients admitted to the hospital, discharge time is determined by your                treatment team.               Patients discharged the day of surgery will not be allowed to drive  home.  Name and phone number of your driver:  Special Instructions: Shower using CHG 2 nights before surgery and the night before surgery.  If you shower the day of surgery use CHG.  Use special wash - you have one bottle of CHG for all showers.  You should use approximately 1/3 of the bottle for each shower.   Please read over the following fact sheets that you were given: Pain Booklet, Coughing and Deep Breathing, MRSA Information and Surgical Site Infection Prevention

## 2014-10-10 ENCOUNTER — Encounter (HOSPITAL_COMMUNITY): Payer: Self-pay

## 2014-10-10 ENCOUNTER — Encounter (HOSPITAL_COMMUNITY)
Admission: RE | Admit: 2014-10-10 | Discharge: 2014-10-10 | Disposition: A | Discharge status: Home / Self Care | Payer: Worker's Compensation | Source: Ambulatory Visit | Attending: Neurological Surgery | Admitting: Neurological Surgery

## 2014-10-10 DIAGNOSIS — Z01812 Encounter for preprocedural laboratory examination: Secondary | ICD-10-CM | POA: Diagnosis not present

## 2014-10-10 LAB — CBC WITH DIFFERENTIAL/PLATELET
Basophils Absolute: 0.1 10*3/uL (ref 0.0–0.1)
Basophils Relative: 0 % (ref 0–1)
EOS ABS: 0.3 10*3/uL (ref 0.0–0.7)
Eosinophils Relative: 3 % (ref 0–5)
HCT: 43.8 % (ref 39.0–52.0)
HEMOGLOBIN: 14.8 g/dL (ref 13.0–17.0)
Lymphocytes Relative: 33 % (ref 12–46)
Lymphs Abs: 4 10*3/uL (ref 0.7–4.0)
MCH: 31 pg (ref 26.0–34.0)
MCHC: 33.8 g/dL (ref 30.0–36.0)
MCV: 91.6 fL (ref 78.0–100.0)
MONOS PCT: 7 % (ref 3–12)
Monocytes Absolute: 0.9 10*3/uL (ref 0.1–1.0)
NEUTROS ABS: 6.8 10*3/uL (ref 1.7–7.7)
Neutrophils Relative %: 57 % (ref 43–77)
Platelets: 227 10*3/uL (ref 150–400)
RBC: 4.78 MIL/uL (ref 4.22–5.81)
RDW: 12.7 % (ref 11.5–15.5)
WBC: 12.1 10*3/uL — ABNORMAL HIGH (ref 4.0–10.5)

## 2014-10-10 LAB — BASIC METABOLIC PANEL
Anion gap: 13 (ref 5–15)
BUN: 9 mg/dL (ref 6–23)
CHLORIDE: 99 meq/L (ref 96–112)
CO2: 24 mEq/L (ref 19–32)
Calcium: 9.2 mg/dL (ref 8.4–10.5)
Creatinine, Ser: 0.89 mg/dL (ref 0.50–1.35)
GFR calc non Af Amer: >90 mL/min (ref >90)
GLUCOSE: 122 mg/dL — AB (ref 70–99)
Potassium: 4.1 mEq/L (ref 3.7–5.3)
SODIUM: 136 meq/L — AB (ref 137–147)

## 2014-10-10 LAB — PROTIME-INR
INR: 1.02 (ref 0.00–1.49)
Prothrombin Time: 13.5 seconds (ref 11.6–15.2)

## 2014-10-10 LAB — SURGICAL PCR SCREEN
MRSA, PCR: NEGATIVE
STAPHYLOCOCCUS AUREUS: NEGATIVE

## 2014-10-14 MED ORDER — DEXAMETHASONE SODIUM PHOSPHATE 10 MG/ML IJ SOLN
10.0000 mg | INTRAMUSCULAR | Status: AC
  Administered 2014-10-15: 10 mg via INTRAVENOUS
  Filled 2014-10-14: qty 1

## 2014-10-14 MED ORDER — DEXTROSE 5 % IV SOLN
3.0000 g | INTRAVENOUS | Status: AC
  Administered 2014-10-15: 3 g via INTRAVENOUS
  Filled 2014-10-14: qty 3000

## 2014-10-15 ENCOUNTER — Ambulatory Visit (HOSPITAL_COMMUNITY)
Admission: RE | Admit: 2014-10-15 | Discharge: 2014-10-15 | Disposition: A | Discharge status: Home / Self Care | Payer: Worker's Compensation | Source: Ambulatory Visit | Attending: Neurological Surgery | Admitting: Neurological Surgery

## 2014-10-15 ENCOUNTER — Ambulatory Visit (HOSPITAL_COMMUNITY): Payer: Worker's Compensation | Admitting: Certified Registered"

## 2014-10-15 ENCOUNTER — Ambulatory Visit (HOSPITAL_COMMUNITY): Payer: Worker's Compensation

## 2014-10-15 ENCOUNTER — Encounter (HOSPITAL_COMMUNITY)
Admission: RE | Disposition: A | Discharge status: Home / Self Care | Payer: Self-pay | Source: Ambulatory Visit | Attending: Neurological Surgery

## 2014-10-15 ENCOUNTER — Encounter (HOSPITAL_COMMUNITY): Payer: Self-pay | Admitting: Neurological Surgery

## 2014-10-15 DIAGNOSIS — M4806 Spinal stenosis, lumbar region: Secondary | ICD-10-CM | POA: Diagnosis not present

## 2014-10-15 DIAGNOSIS — M48061 Spinal stenosis, lumbar region without neurogenic claudication: Secondary | ICD-10-CM

## 2014-10-15 DIAGNOSIS — F419 Anxiety disorder, unspecified: Secondary | ICD-10-CM | POA: Insufficient documentation

## 2014-10-15 DIAGNOSIS — Z9889 Other specified postprocedural states: Secondary | ICD-10-CM

## 2014-10-15 HISTORY — PX: LUMBAR LAMINECTOMY/DECOMPRESSION MICRODISCECTOMY: SHX5026

## 2014-10-15 SURGERY — LUMBAR LAMINECTOMY/DECOMPRESSION MICRODISCECTOMY 1 LEVEL
Anesthesia: General | Site: Back | Laterality: Right

## 2014-10-15 MED ORDER — NEOSTIGMINE METHYLSULFATE 10 MG/10ML IV SOLN
INTRAVENOUS | Status: DC | PRN
  Administered 2014-10-15: 5 mg via INTRAVENOUS

## 2014-10-15 MED ORDER — HYDROMORPHONE HCL 1 MG/ML IJ SOLN
INTRAMUSCULAR | Status: AC
  Filled 2014-10-15: qty 1

## 2014-10-15 MED ORDER — ARTIFICIAL TEARS OP OINT
TOPICAL_OINTMENT | OPHTHALMIC | Status: DC | PRN
  Administered 2014-10-15: 1 via OPHTHALMIC

## 2014-10-15 MED ORDER — PHENYLEPHRINE HCL 10 MG/ML IJ SOLN
INTRAMUSCULAR | Status: DC | PRN
  Administered 2014-10-15: 80 ug via INTRAVENOUS

## 2014-10-15 MED ORDER — MORPHINE SULFATE 2 MG/ML IJ SOLN: 1.0000 mg | INTRAMUSCULAR | Status: DC | PRN

## 2014-10-15 MED ORDER — ONDANSETRON HCL 4 MG/2ML IJ SOLN: 4.0000 mg | INTRAMUSCULAR | Status: DC | PRN

## 2014-10-15 MED ORDER — METHOCARBAMOL 500 MG PO TABS: 500.0000 mg | ORAL_TABLET | Freq: Four times a day (QID) | ORAL | Status: DC | PRN

## 2014-10-15 MED ORDER — PROPOFOL 10 MG/ML IV BOLUS
INTRAVENOUS | Status: DC | PRN
  Administered 2014-10-15: 200 mg via INTRAVENOUS
  Administered 2014-10-15: 50 mg via INTRAVENOUS
  Administered 2014-10-15: 90 mg via INTRAVENOUS

## 2014-10-15 MED ORDER — ARTIFICIAL TEARS OP OINT
TOPICAL_OINTMENT | OPHTHALMIC | Status: AC
  Filled 2014-10-15: qty 3.5

## 2014-10-15 MED ORDER — THROMBIN 5000 UNITS EX SOLR
OROMUCOSAL | Status: DC | PRN
  Administered 2014-10-15: 5 mL via TOPICAL

## 2014-10-15 MED ORDER — MIDAZOLAM HCL 5 MG/5ML IJ SOLN
INTRAMUSCULAR | Status: DC | PRN
  Administered 2014-10-15: 2 mg via INTRAVENOUS

## 2014-10-15 MED ORDER — PROPOFOL 10 MG/ML IV BOLUS
INTRAVENOUS | Status: AC
  Filled 2014-10-15: qty 20

## 2014-10-15 MED ORDER — METHOCARBAMOL 500 MG PO TABS
ORAL_TABLET | ORAL | Status: AC
  Administered 2014-10-15: 500 mg
  Filled 2014-10-15: qty 1

## 2014-10-15 MED ORDER — SODIUM CHLORIDE 0.9 % IJ SOLN: 3.0000 mL | INTRAMUSCULAR | Status: DC | PRN

## 2014-10-15 MED ORDER — SUCCINYLCHOLINE CHLORIDE 20 MG/ML IJ SOLN
INTRAMUSCULAR | Status: DC | PRN
  Administered 2014-10-15: 130 mg via INTRAVENOUS

## 2014-10-15 MED ORDER — CEFAZOLIN SODIUM 1-5 GM-% IV SOLN
1.0000 g | Freq: Three times a day (TID) | INTRAVENOUS | Status: DC
  Filled 2014-10-15 (×2): qty 50

## 2014-10-15 MED ORDER — ROCURONIUM BROMIDE 100 MG/10ML IV SOLN
INTRAVENOUS | Status: DC | PRN
  Administered 2014-10-15: 50 mg via INTRAVENOUS

## 2014-10-15 MED ORDER — DEXTROSE 5 % IV SOLN
500.0000 mg | Freq: Four times a day (QID) | INTRAVENOUS | Status: DC | PRN
  Filled 2014-10-15: qty 5

## 2014-10-15 MED ORDER — LACTATED RINGERS IV SOLN
INTRAVENOUS | Status: DC | PRN
  Administered 2014-10-15 (×2): via INTRAVENOUS

## 2014-10-15 MED ORDER — SODIUM CHLORIDE 0.9 % IJ SOLN: 3.0000 mL | Freq: Two times a day (BID) | INTRAMUSCULAR | Status: DC

## 2014-10-15 MED ORDER — DEXAMETHASONE 4 MG PO TABS
4.0000 mg | ORAL_TABLET | Freq: Four times a day (QID) | ORAL | Status: DC
  Administered 2014-10-15: 4 mg via ORAL
  Filled 2014-10-15: qty 1

## 2014-10-15 MED ORDER — FENTANYL CITRATE 0.05 MG/ML IJ SOLN
INTRAMUSCULAR | Status: DC | PRN
  Administered 2014-10-15: 100 ug via INTRAVENOUS

## 2014-10-15 MED ORDER — OXYCODONE HCL 5 MG/5ML PO SOLN: 5.0000 mg | Freq: Once | ORAL | Status: DC | PRN

## 2014-10-15 MED ORDER — DEXAMETHASONE SODIUM PHOSPHATE 4 MG/ML IJ SOLN: 4.0000 mg | Freq: Four times a day (QID) | INTRAMUSCULAR | Status: DC

## 2014-10-15 MED ORDER — ZOLPIDEM TARTRATE 5 MG PO TABS: 5.0000 mg | ORAL_TABLET | Freq: Every evening | ORAL | Status: DC | PRN

## 2014-10-15 MED ORDER — INFLUENZA VAC SPLIT QUAD 0.5 ML IM SUSY
0.5000 mL | PREFILLED_SYRINGE | Freq: Once | INTRAMUSCULAR | Status: AC
  Administered 2014-10-15: 0.5 mL via INTRAMUSCULAR
  Filled 2014-10-15: qty 0.5

## 2014-10-15 MED ORDER — OXYCODONE HCL 5 MG PO TABS: 5.0000 mg | ORAL_TABLET | Freq: Once | ORAL | Status: DC | PRN

## 2014-10-15 MED ORDER — SODIUM CHLORIDE 0.9 % IJ SOLN
INTRAMUSCULAR | Status: AC
  Filled 2014-10-15: qty 10

## 2014-10-15 MED ORDER — ROCURONIUM BROMIDE 50 MG/5ML IV SOLN
INTRAVENOUS | Status: AC
  Filled 2014-10-15: qty 1

## 2014-10-15 MED ORDER — GLYCOPYRROLATE 0.2 MG/ML IJ SOLN
INTRAMUSCULAR | Status: DC | PRN
  Administered 2014-10-15: .8 mg via INTRAVENOUS

## 2014-10-15 MED ORDER — PROMETHAZINE HCL 25 MG/ML IJ SOLN: 6.2500 mg | INTRAMUSCULAR | Status: DC | PRN

## 2014-10-15 MED ORDER — ACETAMINOPHEN 325 MG PO TABS: 650.0000 mg | ORAL_TABLET | ORAL | Status: DC | PRN

## 2014-10-15 MED ORDER — THROMBIN 5000 UNITS EX SOLR
CUTANEOUS | Status: DC | PRN
  Administered 2014-10-15 (×2): 5000 [IU] via TOPICAL

## 2014-10-15 MED ORDER — SUCCINYLCHOLINE CHLORIDE 20 MG/ML IJ SOLN
INTRAMUSCULAR | Status: AC
  Filled 2014-10-15: qty 1

## 2014-10-15 MED ORDER — SENNA 8.6 MG PO TABS
1.0000 | ORAL_TABLET | Freq: Two times a day (BID) | ORAL | Status: DC
  Administered 2014-10-15: 8.6 mg via ORAL
  Filled 2014-10-15: qty 1

## 2014-10-15 MED ORDER — POTASSIUM CHLORIDE IN NACL 20-0.9 MEQ/L-% IV SOLN
INTRAVENOUS | Status: DC
  Filled 2014-10-15 (×2): qty 1000

## 2014-10-15 MED ORDER — OXYCODONE-ACETAMINOPHEN 5-325 MG PO TABS
1.0000 | ORAL_TABLET | ORAL | Status: DC | PRN
  Administered 2014-10-15: 2 via ORAL
  Filled 2014-10-15: qty 2

## 2014-10-15 MED ORDER — MIDAZOLAM HCL 2 MG/2ML IJ SOLN
INTRAMUSCULAR | Status: AC
  Filled 2014-10-15: qty 2

## 2014-10-15 MED ORDER — HEMOSTATIC AGENTS (NO CHARGE) OPTIME
TOPICAL | Status: DC | PRN
  Administered 2014-10-15: 1 via TOPICAL

## 2014-10-15 MED ORDER — ONDANSETRON HCL 4 MG/2ML IJ SOLN
INTRAMUSCULAR | Status: DC | PRN
  Administered 2014-10-15: 4 mg via INTRAVENOUS

## 2014-10-15 MED ORDER — FENTANYL CITRATE 0.05 MG/ML IJ SOLN
INTRAMUSCULAR | Status: AC
  Filled 2014-10-15: qty 5

## 2014-10-15 MED ORDER — MENTHOL 3 MG MT LOZG
1.0000 | LOZENGE | OROMUCOSAL | Status: DC | PRN
  Filled 2014-10-15: qty 9

## 2014-10-15 MED ORDER — LIDOCAINE HCL (CARDIAC) 20 MG/ML IV SOLN
INTRAVENOUS | Status: AC
  Filled 2014-10-15: qty 5

## 2014-10-15 MED ORDER — SODIUM CHLORIDE 0.9 % IR SOLN
Status: DC | PRN
  Administered 2014-10-15: 500 mL

## 2014-10-15 MED ORDER — SODIUM CHLORIDE 0.9 % IV SOLN: 250.0000 mL | INTRAVENOUS | Status: DC

## 2014-10-15 MED ORDER — PHENOL 1.4 % MT LIQD
1.0000 | OROMUCOSAL | Status: DC | PRN
  Filled 2014-10-15: qty 177

## 2014-10-15 MED ORDER — LIDOCAINE HCL (CARDIAC) 20 MG/ML IV SOLN
INTRAVENOUS | Status: DC | PRN
  Administered 2014-10-15: 100 mg via INTRAVENOUS

## 2014-10-15 MED ORDER — EPHEDRINE SULFATE 50 MG/ML IJ SOLN
INTRAMUSCULAR | Status: AC
  Filled 2014-10-15: qty 1

## 2014-10-15 MED ORDER — ACETAMINOPHEN 650 MG RE SUPP: 650.0000 mg | RECTAL | Status: DC | PRN

## 2014-10-15 MED ORDER — BUPIVACAINE HCL (PF) 0.25 % IJ SOLN
INTRAMUSCULAR | Status: DC | PRN
  Administered 2014-10-15: 10 mL

## 2014-10-15 MED ORDER — ONDANSETRON HCL 4 MG/2ML IJ SOLN
INTRAMUSCULAR | Status: AC
  Filled 2014-10-15: qty 2

## 2014-10-15 MED ORDER — HYDROMORPHONE HCL 1 MG/ML IJ SOLN
0.2500 mg | INTRAMUSCULAR | Status: DC | PRN
  Administered 2014-10-15 (×4): 0.5 mg via INTRAVENOUS

## 2014-10-15 SURGICAL SUPPLY — 46 items
BAG DECANTER FOR FLEXI CONT (MISCELLANEOUS) ×3 IMPLANT
BENZOIN TINCTURE PRP APPL 2/3 (GAUZE/BANDAGES/DRESSINGS) ×3 IMPLANT
BUR MATCHSTICK NEURO 3.0 LAGG (BURR) ×3 IMPLANT
CANISTER SUCT 3000ML (MISCELLANEOUS) ×3 IMPLANT
CLOSURE WOUND 1/2 X4 (GAUZE/BANDAGES/DRESSINGS) ×1
CONT SPEC 4OZ CLIKSEAL STRL BL (MISCELLANEOUS) ×3 IMPLANT
DRAPE LAPAROTOMY 100X72X124 (DRAPES) ×3 IMPLANT
DRAPE MICROSCOPE LEICA (MISCELLANEOUS) ×3 IMPLANT
DRAPE POUCH INSTRU U-SHP 10X18 (DRAPES) ×3 IMPLANT
DRAPE SURG 17X23 STRL (DRAPES) ×3 IMPLANT
DRSG OPSITE 4X5.5 SM (GAUZE/BANDAGES/DRESSINGS) ×3 IMPLANT
DRSG OPSITE POSTOP 4X6 (GAUZE/BANDAGES/DRESSINGS) ×3 IMPLANT
DRSG TELFA 3X8 NADH (GAUZE/BANDAGES/DRESSINGS) IMPLANT
DURAPREP 26ML APPLICATOR (WOUND CARE) ×3 IMPLANT
ELECT REM PT RETURN 9FT ADLT (ELECTROSURGICAL) ×3
ELECTRODE REM PT RTRN 9FT ADLT (ELECTROSURGICAL) ×1 IMPLANT
GAUZE SPONGE 4X4 16PLY XRAY LF (GAUZE/BANDAGES/DRESSINGS) IMPLANT
GLOVE BIO SURGEON STRL SZ8 (GLOVE) ×3 IMPLANT
GLOVE BIOGEL PI IND STRL 7.5 (GLOVE) ×2 IMPLANT
GLOVE BIOGEL PI INDICATOR 7.5 (GLOVE) ×4
GLOVE SURG SS PI 7.0 STRL IVOR (GLOVE) ×6 IMPLANT
GLOVE SURG SS PI 7.5 STRL IVOR (GLOVE) ×3 IMPLANT
GOWN STRL REUS W/ TWL LRG LVL3 (GOWN DISPOSABLE) ×1 IMPLANT
GOWN STRL REUS W/ TWL XL LVL3 (GOWN DISPOSABLE) ×1 IMPLANT
GOWN STRL REUS W/TWL 2XL LVL3 (GOWN DISPOSABLE) IMPLANT
GOWN STRL REUS W/TWL LRG LVL3 (GOWN DISPOSABLE) ×2
GOWN STRL REUS W/TWL XL LVL3 (GOWN DISPOSABLE) ×2
HEMOSTAT POWDER KIT SURGIFOAM (HEMOSTASIS) ×3 IMPLANT
KIT BASIN OR (CUSTOM PROCEDURE TRAY) ×3 IMPLANT
KIT ROOM TURNOVER OR (KITS) ×3 IMPLANT
NEEDLE HYPO 25X1 1.5 SAFETY (NEEDLE) ×3 IMPLANT
NEEDLE SPNL 20GX3.5 QUINCKE YW (NEEDLE) IMPLANT
NS IRRIG 1000ML POUR BTL (IV SOLUTION) ×3 IMPLANT
PACK LAMINECTOMY NEURO (CUSTOM PROCEDURE TRAY) ×3 IMPLANT
PAD ARMBOARD 7.5X6 YLW CONV (MISCELLANEOUS) ×9 IMPLANT
RUBBERBAND STERILE (MISCELLANEOUS) ×6 IMPLANT
SPONGE SURGIFOAM ABS GEL SZ50 (HEMOSTASIS) ×3 IMPLANT
STRIP CLOSURE SKIN 1/2X4 (GAUZE/BANDAGES/DRESSINGS) ×2 IMPLANT
SUT VIC AB 0 CT1 18XCR BRD8 (SUTURE) ×1 IMPLANT
SUT VIC AB 0 CT1 8-18 (SUTURE) ×2
SUT VIC AB 2-0 CP2 18 (SUTURE) ×3 IMPLANT
SUT VIC AB 3-0 SH 8-18 (SUTURE) ×3 IMPLANT
SYR 20ML ECCENTRIC (SYRINGE) ×3 IMPLANT
TOWEL OR 17X24 6PK STRL BLUE (TOWEL DISPOSABLE) ×3 IMPLANT
TOWEL OR 17X26 10 PK STRL BLUE (TOWEL DISPOSABLE) ×3 IMPLANT
WATER STERILE IRR 1000ML POUR (IV SOLUTION) ×3 IMPLANT

## 2014-10-15 NOTE — Op Note (Signed)
10/15/2014  10:28 AM  PATIENT:  Raymond [name]  43 y.o. male  PRE-OPERATIVE DIAGNOSIS:  Lumbar spinal stenosis L4-5 with back and right leg pain  POST-OPERATIVE DIAGNOSIS:  Same  PROCEDURE:  Right L4-5 hemilaminectomy, medial facetectomy, and foraminotomy  SURGEON:  Marikay Alaravid Cornisha Zetino, MD  ASSISTANTS: None  ANESTHESIA:   General  EBL: 50 ml  Total I/O In: 1000 [I.V.:1000] Out: 50 [Blood:50]  BLOOD ADMINISTERED:none  DRAINS: None   SPECIMEN:  No Specimen  INDICATION FOR PROCEDURE: This patient presented with severe back and right leg pain. He had a partial foot drop on the right. EMGs confirmed a right L5 radiculopathy. I recommended decompressive hemilaminectomy when a MRI and CT myelogram showed significant stenosis at the pedicle level at L4-5 on the right. Patient understood the risks, benefits, and alternatives and potential outcomes and wished to proceed.  PROCEDURE DETAILS: The patient was taken to the operating room and after induction of adequate generalized endotracheal anesthesia, the patient was rolled into the prone position on the Wilson frame and all pressure points were padded. The lumbar region was cleaned and then prepped with DuraPrep and draped in the usual sterile fashion. 5 cc of local anesthesia was injected and then a dorsal midline incision was made and carried down to the lumbo sacral fascia. The fascia was opened and the paraspinous musculature was taken down in a subperiosteal fashion to expose L4-5 on the right. Intraoperative x-ray confirmed my level, and then I used a combination of the high-speed drill and the Kerrison punches to perform a hemilaminectomy, medial facetectomy, and foraminotomy at L4-5 on the right. The underlying yellow ligament was opened and removed in a piecemeal fashion to expose the underlying dura and exiting nerve root. I undercut the lateral recess and dissected down until I was medial to and distal to the pedicle. The nerve root was  well decompressed. We then gently retracted the nerve root medially with a retractor, coagulated the epidural venous vasculature, and inspected the disc space. I found no significant disc herniation. I then palpated with a coronary dilator along the nerve root and into the foramen to assure adequate decompression. I felt no more compression of the nerve root. I irrigated with saline solution containing bacitracin. Achieved hemostasis with bipolar cautery, lined the dura with Gelfoam, and then closed the fascia with 0 Vicryl. I closed the subcutaneous tissues with 2-0 Vicryl and the subcuticular tissues with 3-0 Vicryl. The skin was then closed with benzoin and Steri-Strips. The drapes were removed, a sterile dressing was applied. The patient was awakened from general anesthesia and transferred to the recovery room in stable condition. At the end of the procedure all sponge, needle and instrument counts were correct.   PLAN OF CARE: Admit for overnight observation  PATIENT DISPOSITION:  PACU - hemodynamically stable.   Delay start of Pharmacological VTE agent (>24hrs) due to surgical blood loss or risk of bleeding:  yes

## 2014-10-15 NOTE — Discharge Summary (Signed)
Physician Discharge Summary  Patient ID: Raymond [name] E Gravette MRN: [id number] DOB/AGE: 11/07/1970 43 y.o.  Admit date: 10/15/2014 Discharge date: 10/15/2014  Admission Diagnoses: lumbar stenosis    Discharge Diagnoses: same   Discharged Condition: good  Hospital Course: The patient was admitted on 10/15/2014 and taken to the operating room where the patient underwent R L4-5 DLL. The patient tolerated the procedure well and was taken to the recovery room and then to the floor in stable condition. The hospital course was routine. There were no complications. The wound remained clean dry and intact. Pt had appropriate back soreness. No complaints of leg pain or new N/T/W. The patient remained afebrile with stable vital signs, and tolerated a regular diet. The patient continued to increase activities, and pain was well controlled with oral pain medications.   Consults: None  Significant Diagnostic Studies:  Results for orders placed or performed during the hospital encounter of 10/10/14  Surgical pcr screen  Result Value Ref Range   MRSA, PCR NEGATIVE NEGATIVE   Staphylococcus aureus NEGATIVE NEGATIVE  Basic metabolic panel  Result Value Ref Range   Sodium 136 (L) 137 - 147 mEq/L   Potassium 4.1 3.7 - 5.3 mEq/L   Chloride 99 96 - 112 mEq/L   CO2 24 19 - 32 mEq/L   Glucose, Bld 122 (H) 70 - 99 mg/dL   BUN 9 6 - 23 mg/dL   Creatinine, Ser 4.090.89 0.50 - 1.35 mg/dL   Calcium 9.2 8.4 - 81.110.5 mg/dL   GFR calc non Af Amer >90 >90 mL/min   GFR calc Af Amer >90 >90 mL/min   Anion gap 13 5 - 15  CBC WITH DIFFERENTIAL  Result Value Ref Range   WBC 12.1 (H) 4.0 - 10.5 K/uL   RBC 4.78 4.22 - 5.81 MIL/uL   Hemoglobin 14.8 13.0 - 17.0 g/dL   HCT 91.443.8 78.239.0 - 95.652.0 %   MCV 91.6 78.0 - 100.0 fL   MCH 31.0 26.0 - 34.0 pg   MCHC 33.8 30.0 - 36.0 g/dL   RDW 21.312.7 08.611.5 - 57.815.5 %   Platelets 227 150 - 400 K/uL   Neutrophils Relative % 57 43 - 77 %   Neutro Abs 6.8 1.7 - 7.7 K/uL   Lymphocytes Relative  33 12 - 46 %   Lymphs Abs 4.0 0.7 - 4.0 K/uL   Monocytes Relative 7 3 - 12 %   Monocytes Absolute 0.9 0.1 - 1.0 K/uL   Eosinophils Relative 3 0 - 5 %   Eosinophils Absolute 0.3 0.0 - 0.7 K/uL   Basophils Relative 0 0 - 1 %   Basophils Absolute 0.1 0.0 - 0.1 K/uL  Protime-INR  Result Value Ref Range   Prothrombin Time 13.5 11.6 - 15.2 seconds   INR 1.02 0.00 - 1.49    Dg Lumbar Spine 2-3 Views  10/15/2014   CLINICAL DATA:  Surgical localization image for lumbar laminectomy  EXAM: LUMBAR SPINE - 2-3 VIEW  COMPARISON:  02/20/2014  FINDINGS: Portable cross-table lateral view shows a surgical needle with its tip superimposed over superior margin of the L4 spinous process, and posterior to the central upper aspect of the posterior L4 vertebral body.  IMPRESSION: Surgical localization image as detailed.   Electronically Signed   By: Amie Portlandavid  Ormond M.D.   On: 10/15/2014 10:40    Antibiotics:  Anti-infectives    Start     Dose/Rate Route Frequency Ordered Stop   10/15/14 1630  ceFAZolin (ANCEF) IVPB 1 g/50  mL premix     1 g100 mL/hr over 30 Minutes Intravenous Every 8 hours 10/15/14 1228 10/16/14 0829   10/15/14 0938  bacitracin 50,000 Units in sodium chloride irrigation 0.9 % 500 mL irrigation  Status:  Discontinued       As needed 10/15/14 0938 10/15/14 1036   10/15/14 0600  ceFAZolin (ANCEF) 3 g in dextrose 5 % 50 mL IVPB     3 g160 mL/hr over 30 Minutes Intravenous On call to O.R. 10/14/14 1408 10/15/14 0845      Discharge Exam: Blood pressure 158/89, pulse 92, temperature 98.6 F (37 C), temperature source Oral, resp. rate 16, weight 351 lb (159.213 kg), SpO2 97 %. Neurologic: Grossly normal Incision CDI  Discharge Medications:     Medication List    ASK your doctor about these medications        ibuprofen 200 MG tablet  Commonly known as:  ADVIL,MOTRIN  Take 800 mg by mouth every 6 (six) hours as needed for moderate pain.     methocarbamol 500 MG tablet  Commonly known as:   ROBAXIN  Take 1 tablet (500 mg total) by mouth every 6 (six) hours as needed for muscle spasms.     oxyCODONE-acetaminophen 5-325 MG per tablet  Commonly known as:  PERCOCET/ROXICET  Take 1-2 tablets by mouth every 4 (four) hours as needed for moderate pain.        Disposition: home   Final Dx: R L4-5 DLL       Signed: Marcellis Frampton S 10/15/2014, 6:01 PM

## 2014-10-15 NOTE — Anesthesia Procedure Notes (Signed)
Procedure Name: Intubation Date/Time: 10/15/2014 8:42 AM Performed by: Jefm MilesENNIE, Juvia Aerts E Pre-anesthesia Checklist: Patient identified, Emergency Drugs available, Suction available, Patient being monitored and Timeout performed Patient Re-evaluated:Patient Re-evaluated prior to inductionPreoxygenation: Pre-oxygenation with 100% oxygen Intubation Type: IV induction and Rapid sequence Grade View: Grade I Tube type: Oral Tube size: 7.5 mm Number of attempts: 1 Airway Equipment and Method: Stylet and Video-laryngoscopy Placement Confirmation: ETT inserted through vocal cords under direct vision,  positive ETCO2 and breath sounds checked- equal and bilateral Secured at: 23 cm Tube secured with: Tape Dental Injury: Teeth and Oropharynx as per pre-operative assessment  Difficulty Due To: Difficulty was anticipated and Difficult Airway- due to limited oral opening Future Recommendations: Recommend- induction with short-acting agent, and alternative techniques readily available

## 2014-10-15 NOTE — Anesthesia Preprocedure Evaluation (Addendum)
Anesthesia Evaluation  Patient identified by MRN, date of birth, ID band Patient awake    Reviewed: Allergy & Precautions, H&P , NPO status , Patient's Chart, lab work & pertinent test results  History of Anesthesia Complications Negative for: history of anesthetic complications  Airway Mallampati: III   Neck ROM: Full  Mouth opening: Limited Mouth Opening  Dental  (+) Teeth Intact, Dental Advisory Given, Chipped, Missing,    Pulmonary neg pulmonary ROS,  breath sounds clear to auscultation        Cardiovascular negative cardio ROS  Rhythm:Regular Rate:Normal     Neuro/Psych  Headaches,    GI/Hepatic negative GI ROS, Neg liver ROS,   Endo/Other  Morbid obesity  Renal/GU negative Renal ROS     Musculoskeletal   Abdominal (+) + obese,   Peds  Hematology   Anesthesia Other Findings   Reproductive/Obstetrics                            Anesthesia Physical Anesthesia Plan  ASA: II  Anesthesia Plan: General   Post-op Pain Management:    Induction: Intravenous  Airway Management Planned: Video Laryngoscope Planned and Oral ETT  Additional Equipment:   Intra-op Plan:   Post-operative Plan: Extubation in OR  Informed Consent: I have reviewed the patients History and Physical, chart, labs and discussed the procedure including the risks, benefits and alternatives for the proposed anesthesia with the patient or authorized representative who has indicated his/her understanding and acceptance.   Dental advisory given  Plan Discussed with: CRNA and Surgeon  Anesthesia Plan Comments:         Anesthesia Quick Evaluation

## 2014-10-15 NOTE — H&P (Signed)
Subjective: Patient is a 43 y.o. male admitted for DLL L4-5 R. Onset of symptoms was months ago, progressive since that time.  The pain is rated mod severe, and is located at the low back and radiates to R leg. The pain is described as aching and occurs intermmittently. The symptoms have been progressive. Symptoms are exacerbated by walking. MRI or CT showed stenosis L4-5.  Past Medical History  Diagnosis Date  . Anxiety     panic attack related to pain & also related to decrease ability to breathe through nose    . Headache(784.0)     past 2 yrs. , migraines resolved   . Nasal airway abnormality     [place]- [place] [facility] [place] ability to breathe through nose, told that he has to get ENT consult     Past Surgical History  Procedure Laterality Date  . Elbow arthroscopy Right 2000    muscle repair   . Cholecystectomy  2002  . Lumbar laminectomy/decompression microdiscectomy N/A 02/20/2014    Procedure: LUMBAR TWO/THREE, LUMBAR FOUR/FIVE, LUMBAR FIVE SACRAL ONE LAMINECTOMY WITH RIGHT LUMBAR THREE/FOUR, LUMBAR FOUR/FIVE AND FIVE SACRAL ONE MICRODISECTOMY;  Surgeon: Tia Alertavid S Joquan Lotz, MD;  Location: [facility];  Service: Neurosurgery;  Laterality: N/A;    Prior to Admission medications   Medication Sig Start Date End Date Taking? Authorizing Provider  methocarbamol (ROBAXIN) 500 MG tablet Take 1 tablet (500 mg total) by mouth every 6 (six) hours as needed for muscle spasms. 02/21/14  Yes Tia Alertavid S Xiomara Sevillano, MD  oxyCODONE-acetaminophen (PERCOCET/ROXICET) 5-325 MG per tablet Take 1-2 tablets by mouth every 4 (four) hours as needed for moderate pain. 02/21/14  Yes Tia Alertavid S Syla Devoss, MD  ibuprofen (ADVIL,MOTRIN) 200 MG tablet Take 800 mg by mouth every 6 (six) hours as needed for moderate pain.     Historical Provider, MD   No Known Allergies  History  Substance Use Topics  . Smoking status: Never Smoker   . Smokeless tobacco: Not on file  . Alcohol Use: No    History reviewed. No pertinent  family history.   Review of Systems  Positive ROS: neg  All other systems have been reviewed and were otherwise negative with the exception of those mentioned in the HPI and as above.  Objective: Vital signs in last 24 hours:    General Appearance: Alert, cooperative, no distress, appears stated age Head: Normocephalic, without obvious abnormality, atraumatic Eyes: PERRL, conjunctiva/corneas clear, EOM's intact    Neck: Supple, symmetrical, trachea midline Back: Symmetric, no curvature, ROM normal, no CVA tenderness Lungs:  respirations unlabored Heart: Regular rate and rhythm Abdomen: Soft, non-tender Extremities: Extremities normal, atraumatic, no cyanosis or edema Pulses: 2+ and symmetric all extremities Skin: Skin color, texture, turgor normal, no rashes or lesions  NEUROLOGIC:   Mental status: Alert and oriented x4,  no aphasia, good attention span, fund of knowledge, and memory Motor Exam - grossly normal Sensory Exam - grossly normal Reflexes: 1+ Coordination - grossly normal Gait - grossly normal Balance - grossly normal Cranial Nerves: I: smell Not tested  II: visual acuity  OS: nl    OD: nl  II: visual fields Full to confrontation  II: pupils Equal, round, reactive to light  III,VII: ptosis None  III,IV,VI: extraocular muscles  Full ROM  V: mastication Normal  V: facial light touch sensation  Normal  V,VII: corneal reflex  Present  VII: facial muscle function - upper  Normal  VII: facial muscle function - lower  Normal  VIII: hearing Not tested  IX: soft palate elevation  Normal  IX,X: gag reflex Present  XI: trapezius strength  5/5  XI: sternocleidomastoid strength 5/5  XI: neck flexion strength  5/5  XII: tongue strength  Normal    Data Review Lab Results  Component Value Date   WBC 12.1* 10/10/2014   HGB 14.8 10/10/2014   HCT 43.8 10/10/2014   MCV 91.6 10/10/2014   PLT 227 10/10/2014   Lab Results  Component Value Date   NA 136* 10/10/2014    K 4.1 10/10/2014   CL 99 10/10/2014   CO2 24 10/10/2014   BUN 9 10/10/2014   CREATININE 0.89 10/10/2014   GLUCOSE 122* 10/10/2014   Lab Results  Component Value Date   INR 1.02 10/10/2014    Assessment/Plan: Patient admitted for Decompressive lum lam L4-5 Right. Patient has failed a reasonable attempt at conservative therapy.  I explained the condition and procedure to the patient and answered any questions.  Patient wishes to proceed with procedure as planned. Understands risks/ benefits and typical outcomes of procedure.   Nickie Warwick S 10/15/2014 7:26 AM

## 2014-10-15 NOTE — Transfer of Care (Signed)
Immediate Anesthesia Transfer of Care Note  Patient: Raymond [name]  Procedure(s) Performed: Procedure(s): LUMBAR FOUR TO FIVE LAMINECTOMY/DECOMPRESSION MICRODISCECTOMY RIGHT 1 LEVEL (Right)  Patient Location: PACU  Anesthesia Type:General  Level of Consciousness: awake, alert  and oriented  Airway & Oxygen Therapy: Patient Spontanous Breathing and Patient connected to nasal cannula oxygen  Post-op Assessment: Report given to PACU RN and Patient moving all extremities X 4  Post vital signs: Reviewed and stable  Complications: No apparent anesthesia complications

## 2014-10-15 NOTE — Progress Notes (Signed)
Pt and wife given D/C instructions with verbal understanding of teaching. Pt's incision is clean and dry with no sign of infection. Pt's IV was removed prior to D/C. Pt D/C'd home via wheelchair @ 1635 per MD order. Pt is stable @ D/C and has no other needs at this time. Rema FendtAshley Jassiel Flye, RN

## 2014-10-15 NOTE — Anesthesia Postprocedure Evaluation (Signed)
  Anesthesia Post-op Note  Patient: Raymond SandiferJames E Straub  Procedure(s) Performed: Procedure(s): LUMBAR FOUR TO FIVE LAMINECTOMY/DECOMPRESSION MICRODISCECTOMY RIGHT 1 LEVEL (Right)  Patient Location: PACU  Anesthesia Type:General  Level of Consciousness: awake and alert   Airway and Oxygen Therapy: Patient Spontanous Breathing  Post-op Pain: mild  Post-op Assessment: Post-op Vital signs reviewed  Post-op Vital Signs: stable  Last Vitals:  Filed Vitals:   10/15/14 1200  BP: 128/80  Pulse: 80  Temp:   Resp: 13    Complications: No apparent anesthesia complications

## 2014-10-20 ENCOUNTER — Encounter (HOSPITAL_COMMUNITY): Payer: Self-pay | Admitting: Neurological Surgery

## 2015-03-25 ENCOUNTER — Other Ambulatory Visit: Payer: Self-pay | Admitting: Neurological Surgery

## 2015-03-25 DIAGNOSIS — M48061 Spinal stenosis, lumbar region without neurogenic claudication: Secondary | ICD-10-CM

## 2015-04-05 ENCOUNTER — Other Ambulatory Visit: Payer: Self-pay

## 2015-04-08 ENCOUNTER — Emergency Department (HOSPITAL_COMMUNITY)
Admission: EM | Admit: 2015-04-08 | Discharge: 2015-04-08 | Disposition: A | Discharge status: Home / Self Care | Payer: Self-pay | Attending: Emergency Medicine | Admitting: Emergency Medicine

## 2015-04-08 ENCOUNTER — Ambulatory Visit
Admission: RE | Admit: 2015-04-08 | Discharge: 2015-04-08 | Disposition: A | Discharge status: Home / Self Care | Payer: Worker's Compensation | Source: Ambulatory Visit | Attending: Neurological Surgery | Admitting: Neurological Surgery

## 2015-04-08 ENCOUNTER — Encounter (HOSPITAL_COMMUNITY): Payer: Self-pay | Admitting: Emergency Medicine

## 2015-04-08 ENCOUNTER — Other Ambulatory Visit: Payer: Self-pay | Admitting: Neurological Surgery

## 2015-04-08 DIAGNOSIS — M48061 Spinal stenosis, lumbar region without neurogenic claudication: Secondary | ICD-10-CM

## 2015-04-08 DIAGNOSIS — Z79899 Other long term (current) drug therapy: Secondary | ICD-10-CM | POA: Insufficient documentation

## 2015-04-08 DIAGNOSIS — Z91041 Radiographic dye allergy status: Secondary | ICD-10-CM

## 2015-04-08 DIAGNOSIS — T7840XA Allergy, unspecified, initial encounter: Secondary | ICD-10-CM

## 2015-04-08 DIAGNOSIS — T508X5A Adverse effect of diagnostic agents, initial encounter: Secondary | ICD-10-CM | POA: Insufficient documentation

## 2015-04-08 DIAGNOSIS — R0989 Other specified symptoms and signs involving the circulatory and respiratory systems: Secondary | ICD-10-CM | POA: Insufficient documentation

## 2015-04-08 DIAGNOSIS — Z8659 Personal history of other mental and behavioral disorders: Secondary | ICD-10-CM | POA: Insufficient documentation

## 2015-04-08 MED ORDER — METHYLPREDNISOLONE SODIUM SUCC 125 MG IJ SOLR
125.0000 mg | Freq: Once | INTRAMUSCULAR | Status: AC
  Administered 2015-04-08: 125 mg via INTRAVENOUS
  Filled 2015-04-08: qty 2

## 2015-04-08 MED ORDER — METHYLPREDNISOLONE SODIUM SUCC 125 MG IJ SOLR: 125.0000 mg | Freq: Once | INTRAMUSCULAR | Status: DC

## 2015-04-08 MED ORDER — MORPHINE SULFATE 4 MG/ML IJ SOLN
4.0000 mg | Freq: Once | INTRAMUSCULAR | Status: AC
  Administered 2015-04-08: 4 mg via INTRAVENOUS
  Filled 2015-04-08: qty 1

## 2015-04-08 MED ORDER — MORPHINE SULFATE 4 MG/ML IJ SOLN: 4.0000 mg | Freq: Once | INTRAMUSCULAR | Status: DC

## 2015-04-08 MED ORDER — FAMOTIDINE 20 MG PO TABS: 20.0000 mg | ORAL_TABLET | Freq: Two times a day (BID) | ORAL | Status: DC

## 2015-04-08 MED ORDER — DIPHENHYDRAMINE HCL 25 MG PO TABS: 25.0000 mg | ORAL_TABLET | Freq: Four times a day (QID) | ORAL | Status: DC

## 2015-04-08 NOTE — ED Provider Notes (Signed)
CSN: [id number]     Arrival date & time 04/08/15  1824 History   First MD Initiated Contact with Patient 04/08/15 1833     Chief Complaint  Patient presents with  . Allergic Reaction     HPI Patient presents to emergency department after an allergic reaction at the outpatient imaging center.  He was receiving an MRI of his back.  He started feeling a sense of throat closing.  He was given Benadryl and Zantac by EMS.  He reports she's feeling better at this time.  He was not given steroids by either EMS or the imaging facility per the patient.  Reports no history of similar allergic reactions before in the past.  His symptoms are mild and improving.   Past Medical History  Diagnosis Date  . Anxiety     panic attack related to pain & also related to decrease ability to breathe through nose    . Headache(784.0)     past 2 yrs. , migraines resolved   . Nasal airway abnormality     2015- seen in [facility] [place] ED for decreased ability to breathe through nose, told that he has to get ENT consult    Past Surgical History  Procedure Laterality Date  . Elbow arthroscopy Right 2000    muscle repair   . Cholecystectomy  2002  . Lumbar laminectomy/decompression microdiscectomy N/A 02/20/2014    Procedure: LUMBAR TWO/THREE, LUMBAR FOUR/FIVE, LUMBAR FIVE SACRAL ONE LAMINECTOMY WITH RIGHT LUMBAR THREE/FOUR, LUMBAR FOUR/FIVE AND FIVE SACRAL ONE MICRODISECTOMY;  Surgeon: Tia Alert, MD;  Location: [facility];  Service: Neurosurgery;  Laterality: N/A;  . Lumbar laminectomy/decompression microdiscectomy Right 10/15/2014    Procedure: LUMBAR FOUR TO FIVE LAMINECTOMY/DECOMPRESSION MICRODISCECTOMY RIGHT 1 LEVEL;  Surgeon: Tia Alert, MD;  Location: [facility];  Service: Neurosurgery;  Laterality: Right;   No family history on file. History  Substance Use Topics  . Smoking status: Never Smoker   . Smokeless tobacco: Not on file  . Alcohol Use: No    Review of Systems  All other systems reviewed  and are negative.     Allergies  Gadolinium derivatives  Home Medications   Prior to Admission medications   Medication Sig Start Date End Date Taking? Authorizing Provider  ibuprofen (ADVIL,MOTRIN) 200 MG tablet Take 800 mg by mouth every 6 (six) hours as needed for moderate pain.    Yes Historical Provider, MD  methocarbamol (ROBAXIN) 500 MG tablet Take 1 tablet (500 mg total) by mouth every 6 (six) hours as needed for muscle spasms. 02/21/14  Yes Tia Alert, MD  Oxycodone HCl 10 MG TABS Take 10 mg by mouth every 6 (six) hours as needed (pain in back).  03/27/15  Yes Historical Provider, MD  diphenhydrAMINE (BENADRYL) 25 MG tablet Take 1 tablet (25 mg total) by mouth every 6 (six) hours. 04/08/15   Azalia Bilis, MD  famotidine (PEPCID) 20 MG tablet Take 1 tablet (20 mg total) by mouth 2 (two) times daily. 04/08/15   Azalia Bilis, MD  oxyCODONE-acetaminophen (PERCOCET/ROXICET) 5-325 MG per tablet Take 1-2 tablets by mouth every 4 (four) hours as needed for moderate pain. Patient not taking: Reported on 04/08/2015 02/21/14   Tia Alert, MD   BP 102/51 mmHg  Pulse 68  Temp(Src) 98.2 F (36.8 C) (Oral)  Resp 19  SpO2 98% Physical Exam  Constitutional: He is oriented to person, place, and time. He appears well-developed and well-nourished.  HENT:  Head: Normocephalic and  atraumatic.  Eyes: EOM are normal.  Neck: Normal range of motion.  Cardiovascular: Normal rate, regular rhythm, normal heart sounds and intact distal pulses.   Pulmonary/Chest: Effort normal and breath sounds normal. No stridor. No respiratory distress.  Abdominal: Soft. He exhibits no distension. There is no tenderness.  Musculoskeletal: Normal range of motion.  Neurological: He is alert and oriented to person, place, and time.  Skin: Skin is warm and dry.  Psychiatric: He has a normal mood and affect. Judgment normal.  Nursing note and vitals reviewed.   ED Course  Procedures (including critical care  time) Labs Review Labs Reviewed - No data to display  Imaging Review    EKG Interpretation None      MDM   Final diagnoses:  Allergic reaction, initial encounter  Contrast media allergy    Patient feels much better after Benadryl and IV Solu-Medrol.  Patient was observed in the emergency department for just over 2 hours.  He is feeling much better would like to go home.  Discharge home in good condition.  Primary care follow-up.    Azalia BilisKevin Sophiya Morello, MD 04/09/15 2135

## 2015-04-08 NOTE — Discharge Instructions (Signed)

## 2015-04-08 NOTE — ED Notes (Signed)
Per EMS: [facility].  Pt was getting dye infused when he felt a choking sensation, "like his throat was closing".  Pt was given 50 mg po benadryl.  50 mg zantac given IV by EMS.  18 g in lt FA.  Airway intact.  C/o headache and swelling to bilateral eyes.

## 2016-12-27 DIAGNOSIS — J309 Allergic rhinitis, unspecified: Secondary | ICD-10-CM | POA: Insufficient documentation

## 2016-12-27 DIAGNOSIS — F419 Anxiety disorder, unspecified: Secondary | ICD-10-CM | POA: Insufficient documentation

## 2016-12-27 DIAGNOSIS — I10 Essential (primary) hypertension: Secondary | ICD-10-CM | POA: Insufficient documentation

## 2017-09-27 ENCOUNTER — Other Ambulatory Visit: Payer: Self-pay | Admitting: Student

## 2017-09-27 DIAGNOSIS — M5412 Radiculopathy, cervical region: Secondary | ICD-10-CM

## 2017-09-27 DIAGNOSIS — M5416 Radiculopathy, lumbar region: Secondary | ICD-10-CM

## 2017-12-11 DIAGNOSIS — F40240 Claustrophobia: Secondary | ICD-10-CM | POA: Insufficient documentation

## 2018-02-08 DIAGNOSIS — G8929 Other chronic pain: Secondary | ICD-10-CM | POA: Insufficient documentation

## 2018-02-08 DIAGNOSIS — M549 Dorsalgia, unspecified: Secondary | ICD-10-CM

## 2018-04-08 ENCOUNTER — Other Ambulatory Visit: Payer: Self-pay

## 2018-04-28 ENCOUNTER — Other Ambulatory Visit: Payer: Self-pay

## 2018-04-29 ENCOUNTER — Ambulatory Visit
Admission: RE | Admit: 2018-04-29 | Discharge: 2018-04-29 | Disposition: A | Discharge status: Home / Self Care | Payer: BLUE CROSS/BLUE SHIELD | Source: Ambulatory Visit | Attending: Student | Admitting: Student

## 2018-04-29 DIAGNOSIS — M5416 Radiculopathy, lumbar region: Secondary | ICD-10-CM

## 2018-04-29 DIAGNOSIS — M5412 Radiculopathy, cervical region: Secondary | ICD-10-CM

## 2018-06-04 ENCOUNTER — Other Ambulatory Visit: Payer: Self-pay | Admitting: Neurological Surgery

## 2018-06-25 NOTE — Pre-Procedure Instructions (Signed)
Raymond [name]  06/25/2018     No Pharmacies Listed   Your procedure is scheduled on Thursday, Sept. 5th   Report to [facility] Admitting at 1:00 PM             (posted surgery time 3:00pm - 5:37pm)   Call this number if you have problems the morning of surgery:  [phone]   Remember:   Do not eat any foods or drink any liquids after midnight, Wednesday.              7 days prior to surgery, STOP TAKING any Vitamins, Herbal Supplements, Anti-inflammatories, Blood Thinners.    You will need to STOP taking PHENTERMINE as of today.    Take these medicines the morning of surgery with A SIP OF WATER : Metoprolol, Lexapro    Do not wear jewelry - no rings or watches.  Do not wear lotions, colognes or deodorant.   Men may shave face and neck.  Do not bring valuables to the hospital.  [facility] is not responsible for any belongings or valuables.  Contacts, dentures or bridgework may not be worn into surgery.  Leave your suitcase in the car.  After surgery it may be brought to your room.  For patients admitted to the hospital, discharge time will be determined by your treatment team.  Please read over the following fact sheets that you were given. Pain Booklet, MRSA Information and Surgical Site Infection Prevention       [place]- Preparing For Surgery  Before surgery, you can play an important role. Because skin is not sterile, your skin needs to be as free of germs as possible. You can reduce the number of germs on your skin by washing with CHG (chlorahexidine gluconate) Soap before surgery.  CHG is an antiseptic cleaner which kills germs and bonds with the skin to continue killing germs even after washing.    Oral Hygiene is also important to reduce your risk of infection.    Remember - BRUSH YOUR TEETH THE MORNING OF SURGERY WITH YOUR REGULAR TOOTHPASTE  Please do not use if you have an allergy to CHG or antibacterial soaps. If your skin becomes  reddened/irritated stop using the CHG.  Do not shave (including legs and underarms) for at least 48 hours prior to first CHG shower. It is OK to shave your face.  Please follow these instructions carefully.   1. Shower the NIGHT BEFORE SURGERY and the MORNING OF SURGERY with CHG.   2. If you chose to wash your hair, wash your hair first as usual with your normal shampoo.  3. After you shampoo, rinse your hair and body thoroughly to remove the shampoo.  4. Use CHG as you would any other liquid soap. You can apply CHG directly to the skin and wash gently with a scrungie or a clean washcloth.   5. Apply the CHG Soap to your body ONLY FROM THE NECK DOWN.  Do not use on open wounds or open sores. Avoid contact with your eyes, ears, mouth and genitals (private parts). Wash Face and genitals (private parts)  with your normal soap.  6. Wash thoroughly, paying special attention to the area where your surgery will be performed.  7. Thoroughly rinse your body with warm water from the neck down.  8. DO NOT shower/wash with your normal soap after using and rinsing off the CHG Soap.  9. Pat yourself dry with a CLEAN TOWEL.  10. Wear CLEAN PAJAMAS to bed the night before surgery, wear comfortable clothes the morning of surgery  11. Place CLEAN SHEETS on your bed the night of your first shower and DO NOT SLEEP WITH PETS.  Day of Surgery:  Do not apply any deodorants/lotions.  Please wear clean clothes to the hospital/surgery center.    Remember to brush your teeth WITH YOUR REGULAR TOOTHPASTE.

## 2018-06-25 NOTE — Progress Notes (Signed)
Working up chart, noted patient takes phentermine 37.5mg .  Spoke with [place], [place], she states as long as patient is off of it for 5 days, things should continue as planned.

## 2018-06-26 ENCOUNTER — Encounter (HOSPITAL_COMMUNITY)
Admission: RE | Admit: 2018-06-26 | Discharge: 2018-06-26 | Disposition: A | Discharge status: Home / Self Care | Payer: BLUE CROSS/BLUE SHIELD | Source: Ambulatory Visit | Attending: Neurological Surgery | Admitting: Neurological Surgery

## 2018-06-26 ENCOUNTER — Other Ambulatory Visit: Payer: Self-pay

## 2018-06-26 ENCOUNTER — Ambulatory Visit (HOSPITAL_COMMUNITY)
Admission: RE | Admit: 2018-06-26 | Discharge: 2018-06-26 | Disposition: A | Discharge status: Home / Self Care | Payer: BLUE CROSS/BLUE SHIELD | Source: Ambulatory Visit | Attending: Neurological Surgery | Admitting: Neurological Surgery

## 2018-06-26 ENCOUNTER — Encounter (HOSPITAL_COMMUNITY): Payer: Self-pay

## 2018-06-26 DIAGNOSIS — M4802 Spinal stenosis, cervical region: Secondary | ICD-10-CM | POA: Diagnosis not present

## 2018-06-26 DIAGNOSIS — Z01810 Encounter for preprocedural cardiovascular examination: Secondary | ICD-10-CM | POA: Diagnosis not present

## 2018-06-26 DIAGNOSIS — Z01812 Encounter for preprocedural laboratory examination: Secondary | ICD-10-CM | POA: Insufficient documentation

## 2018-06-26 LAB — BASIC METABOLIC PANEL
ANION GAP: 10 (ref 5–15)
BUN: 8 mg/dL (ref 6–20)
CO2: 27 mmol/L (ref 22–32)
Calcium: 9 mg/dL (ref 8.9–10.3)
Chloride: 103 mmol/L (ref 98–111)
Creatinine, Ser: 1.05 mg/dL (ref 0.61–1.24)
GFR calc Af Amer: >60 mL/min (ref >60)
GLUCOSE: 80 mg/dL (ref 70–99)
Potassium: 3.8 mmol/L (ref 3.5–5.1)
Sodium: 140 mmol/L (ref 135–145)

## 2018-06-26 LAB — SURGICAL PCR SCREEN
MRSA, PCR: NEGATIVE
STAPHYLOCOCCUS AUREUS: POSITIVE — AB

## 2018-06-26 LAB — CBC WITH DIFFERENTIAL/PLATELET
ABS IMMATURE GRANULOCYTES: 0.1 10*3/uL (ref 0.0–0.1)
BASOS ABS: 0.1 10*3/uL (ref 0.0–0.1)
Basophils Relative: 1 %
Eosinophils Absolute: 0.4 10*3/uL (ref 0.0–0.7)
Eosinophils Relative: 3 %
HCT: 43 % (ref 39.0–52.0)
Hemoglobin: 14 g/dL (ref 13.0–17.0)
IMMATURE GRANULOCYTES: 1 %
Lymphocytes Relative: 30 %
Lymphs Abs: 4 10*3/uL (ref 0.7–4.0)
MCH: 30.6 pg (ref 26.0–34.0)
MCHC: 32.6 g/dL (ref 30.0–36.0)
MCV: 93.9 fL (ref 78.0–100.0)
MONO ABS: 1.4 10*3/uL — AB (ref 0.1–1.0)
Monocytes Relative: 10 %
NEUTROS ABS: 7.6 10*3/uL (ref 1.7–7.7)
Neutrophils Relative %: 55 %
Platelets: 303 10*3/uL (ref 150–400)
RBC: 4.58 MIL/uL (ref 4.22–5.81)
RDW: 12.2 % (ref 11.5–15.5)
WBC: 13.5 10*3/uL — ABNORMAL HIGH (ref 4.0–10.5)

## 2018-06-26 LAB — PROTIME-INR
INR: 0.99
PROTHROMBIN TIME: 13 s (ref 11.4–15.2)

## 2018-06-26 NOTE — Progress Notes (Signed)
PCP: Georjean ModeLen Lastinger, MD  Cardiologist: pt denies  EKG: obtained today per order  Stress test: pt denies  ECHO: pt denies  Cardiac Cath: pt denies  Chest x-ray: obtained today per order

## 2018-07-04 MED ORDER — DEXTROSE 5 % IV SOLN
3.0000 g | INTRAVENOUS | Status: AC
  Administered 2018-07-05: 3 g via INTRAVENOUS
  Filled 2018-07-04: qty 3

## 2018-07-05 ENCOUNTER — Encounter (HOSPITAL_COMMUNITY)
Admission: RE | Disposition: A | Discharge status: Home / Self Care | Payer: Self-pay | Source: Ambulatory Visit | Attending: Neurological Surgery

## 2018-07-05 ENCOUNTER — Ambulatory Visit (HOSPITAL_COMMUNITY): Payer: BLUE CROSS/BLUE SHIELD

## 2018-07-05 ENCOUNTER — Encounter (HOSPITAL_COMMUNITY): Payer: Self-pay | Admitting: Neurological Surgery

## 2018-07-05 ENCOUNTER — Other Ambulatory Visit: Payer: Self-pay

## 2018-07-05 ENCOUNTER — Observation Stay (HOSPITAL_COMMUNITY)
Admission: RE | Admit: 2018-07-05 | Discharge: 2018-07-06 | Disposition: A | Discharge status: Home / Self Care | Payer: BLUE CROSS/BLUE SHIELD | Source: Ambulatory Visit | Attending: Neurological Surgery | Admitting: Neurological Surgery

## 2018-07-05 ENCOUNTER — Ambulatory Visit (HOSPITAL_COMMUNITY): Payer: BLUE CROSS/BLUE SHIELD | Admitting: Physician Assistant

## 2018-07-05 ENCOUNTER — Ambulatory Visit (HOSPITAL_COMMUNITY): Payer: BLUE CROSS/BLUE SHIELD | Admitting: Anesthesiology

## 2018-07-05 DIAGNOSIS — Z79899 Other long term (current) drug therapy: Secondary | ICD-10-CM | POA: Diagnosis not present

## 2018-07-05 DIAGNOSIS — I10 Essential (primary) hypertension: Secondary | ICD-10-CM | POA: Diagnosis not present

## 2018-07-05 DIAGNOSIS — M4802 Spinal stenosis, cervical region: Secondary | ICD-10-CM | POA: Diagnosis not present

## 2018-07-05 DIAGNOSIS — M4712 Other spondylosis with myelopathy, cervical region: Secondary | ICD-10-CM | POA: Diagnosis not present

## 2018-07-05 DIAGNOSIS — M4322 Fusion of spine, cervical region: Secondary | ICD-10-CM | POA: Diagnosis present

## 2018-07-05 DIAGNOSIS — F419 Anxiety disorder, unspecified: Secondary | ICD-10-CM | POA: Diagnosis not present

## 2018-07-05 DIAGNOSIS — Z882 Allergy status to sulfonamides status: Secondary | ICD-10-CM | POA: Diagnosis not present

## 2018-07-05 DIAGNOSIS — Z419 Encounter for procedure for purposes other than remedying health state, unspecified: Secondary | ICD-10-CM

## 2018-07-05 HISTORY — PX: ANTERIOR CERVICAL DECOMP/DISCECTOMY FUSION: SHX1161

## 2018-07-05 SURGERY — ANTERIOR CERVICAL DECOMPRESSION/DISCECTOMY FUSION 2 LEVELS
Anesthesia: General

## 2018-07-05 MED ORDER — SODIUM CHLORIDE 0.9 % IV SOLN
INTRAVENOUS | Status: DC | PRN
  Administered 2018-07-05: 15:00:00

## 2018-07-05 MED ORDER — MENTHOL 3 MG MT LOZG
1.0000 | LOZENGE | OROMUCOSAL | Status: DC | PRN
  Filled 2018-07-05: qty 9

## 2018-07-05 MED ORDER — PHENYLEPHRINE 40 MCG/ML (10ML) SYRINGE FOR IV PUSH (FOR BLOOD PRESSURE SUPPORT)
PREFILLED_SYRINGE | INTRAVENOUS | Status: AC
  Filled 2018-07-05: qty 10

## 2018-07-05 MED ORDER — DEXAMETHASONE 4 MG PO TABS: 4.0000 mg | ORAL_TABLET | Freq: Four times a day (QID) | ORAL | Status: DC

## 2018-07-05 MED ORDER — ROCURONIUM BROMIDE 50 MG/5ML IV SOSY
PREFILLED_SYRINGE | INTRAVENOUS | Status: AC
  Filled 2018-07-05: qty 5

## 2018-07-05 MED ORDER — PHENTERMINE HCL 37.5 MG PO TABS: 37.5000 mg | ORAL_TABLET | Freq: Every day | ORAL | Status: DC

## 2018-07-05 MED ORDER — CHLORHEXIDINE GLUCONATE CLOTH 2 % EX PADS: 6.0000 | MEDICATED_PAD | Freq: Once | CUTANEOUS | Status: DC

## 2018-07-05 MED ORDER — MEPERIDINE HCL 50 MG/ML IJ SOLN: 6.2500 mg | INTRAMUSCULAR | Status: DC | PRN

## 2018-07-05 MED ORDER — PHENYLEPHRINE 40 MCG/ML (10ML) SYRINGE FOR IV PUSH (FOR BLOOD PRESSURE SUPPORT)
PREFILLED_SYRINGE | INTRAVENOUS | Status: DC | PRN
  Administered 2018-07-05: 160 ug via INTRAVENOUS
  Administered 2018-07-05 (×2): 120 ug via INTRAVENOUS

## 2018-07-05 MED ORDER — MIDAZOLAM HCL 2 MG/2ML IJ SOLN
INTRAMUSCULAR | Status: AC
  Filled 2018-07-05: qty 2

## 2018-07-05 MED ORDER — THROMBIN 5000 UNITS EX SOLR
OROMUCOSAL | Status: DC | PRN
  Administered 2018-07-05: 15:00:00 via TOPICAL

## 2018-07-05 MED ORDER — ONDANSETRON HCL 4 MG/2ML IJ SOLN
INTRAMUSCULAR | Status: DC | PRN
  Administered 2018-07-05: 4 mg via INTRAVENOUS

## 2018-07-05 MED ORDER — SENNA 8.6 MG PO TABS
1.0000 | ORAL_TABLET | Freq: Two times a day (BID) | ORAL | Status: DC
  Administered 2018-07-05: 8.6 mg via ORAL
  Filled 2018-07-05: qty 1

## 2018-07-05 MED ORDER — PROMETHAZINE HCL 25 MG/ML IJ SOLN: 6.2500 mg | INTRAMUSCULAR | Status: DC | PRN

## 2018-07-05 MED ORDER — 0.9 % SODIUM CHLORIDE (POUR BTL) OPTIME
TOPICAL | Status: DC | PRN
  Administered 2018-07-05: 1000 mL

## 2018-07-05 MED ORDER — HYDROMORPHONE HCL 1 MG/ML IJ SOLN
INTRAMUSCULAR | Status: AC
  Filled 2018-07-05: qty 1

## 2018-07-05 MED ORDER — BUPIVACAINE HCL (PF) 0.25 % IJ SOLN
INTRAMUSCULAR | Status: AC
  Filled 2018-07-05: qty 30

## 2018-07-05 MED ORDER — OXYCODONE HCL 5 MG PO TABS
5.0000 mg | ORAL_TABLET | ORAL | Status: DC | PRN
  Administered 2018-07-05: 5 mg via ORAL

## 2018-07-05 MED ORDER — MIDAZOLAM HCL 2 MG/2ML IJ SOLN
INTRAMUSCULAR | Status: DC | PRN
  Administered 2018-07-05: 2 mg via INTRAVENOUS

## 2018-07-05 MED ORDER — PROPOFOL 10 MG/ML IV BOLUS
INTRAVENOUS | Status: DC | PRN
  Administered 2018-07-05: 200 mg via INTRAVENOUS

## 2018-07-05 MED ORDER — DEXAMETHASONE SODIUM PHOSPHATE 4 MG/ML IJ SOLN
4.0000 mg | Freq: Four times a day (QID) | INTRAMUSCULAR | Status: DC
  Administered 2018-07-05 – 2018-07-06 (×3): 4 mg via INTRAVENOUS
  Filled 2018-07-05 (×3): qty 1

## 2018-07-05 MED ORDER — CEFAZOLIN SODIUM-DEXTROSE 2-4 GM/100ML-% IV SOLN
2.0000 g | Freq: Three times a day (TID) | INTRAVENOUS | Status: AC
  Administered 2018-07-05 – 2018-07-06 (×2): 2 g via INTRAVENOUS
  Filled 2018-07-05 (×2): qty 100

## 2018-07-05 MED ORDER — ESCITALOPRAM OXALATE 20 MG PO TABS
20.0000 mg | ORAL_TABLET | Freq: Every day | ORAL | Status: DC
  Filled 2018-07-05: qty 1

## 2018-07-05 MED ORDER — LACTATED RINGERS IV SOLN
INTRAVENOUS | Status: DC
  Administered 2018-07-05 (×2): via INTRAVENOUS

## 2018-07-05 MED ORDER — DEXAMETHASONE SODIUM PHOSPHATE 10 MG/ML IJ SOLN
10.0000 mg | INTRAMUSCULAR | Status: AC
  Administered 2018-07-05: 10 mg via INTRAVENOUS

## 2018-07-05 MED ORDER — SODIUM CHLORIDE 0.9% FLUSH: 3.0000 mL | INTRAVENOUS | Status: DC | PRN

## 2018-07-05 MED ORDER — ONDANSETRON HCL 4 MG/2ML IJ SOLN
INTRAMUSCULAR | Status: AC
  Filled 2018-07-05: qty 2

## 2018-07-05 MED ORDER — FENTANYL CITRATE (PF) 100 MCG/2ML IJ SOLN
INTRAMUSCULAR | Status: DC | PRN
  Administered 2018-07-05: 100 ug via INTRAVENOUS
  Administered 2018-07-05: 50 ug via INTRAVENOUS

## 2018-07-05 MED ORDER — METOPROLOL SUCCINATE ER 100 MG PO TB24: 100.0000 mg | ORAL_TABLET | Freq: Every day | ORAL | Status: DC

## 2018-07-05 MED ORDER — HEMOSTATIC AGENTS (NO CHARGE) OPTIME
TOPICAL | Status: DC | PRN
  Administered 2018-07-05: 1 via TOPICAL

## 2018-07-05 MED ORDER — SODIUM CHLORIDE 0.9% FLUSH: 3.0000 mL | Freq: Two times a day (BID) | INTRAVENOUS | Status: DC

## 2018-07-05 MED ORDER — SUGAMMADEX SODIUM 200 MG/2ML IV SOLN
INTRAVENOUS | Status: DC | PRN
  Administered 2018-07-05: 300 mg via INTRAVENOUS

## 2018-07-05 MED ORDER — ACETAMINOPHEN 325 MG PO TABS: 650.0000 mg | ORAL_TABLET | ORAL | Status: DC | PRN

## 2018-07-05 MED ORDER — BUPIVACAINE HCL (PF) 0.25 % IJ SOLN
INTRAMUSCULAR | Status: DC | PRN
  Administered 2018-07-05: 4 mL

## 2018-07-05 MED ORDER — PROPOFOL 10 MG/ML IV BOLUS
INTRAVENOUS | Status: AC
  Filled 2018-07-05: qty 20

## 2018-07-05 MED ORDER — SUGAMMADEX SODIUM 500 MG/5ML IV SOLN
INTRAVENOUS | Status: AC
  Filled 2018-07-05: qty 5

## 2018-07-05 MED ORDER — METHOCARBAMOL 500 MG PO TABS
500.0000 mg | ORAL_TABLET | Freq: Four times a day (QID) | ORAL | Status: DC | PRN
  Administered 2018-07-05 – 2018-07-06 (×2): 500 mg via ORAL
  Filled 2018-07-05: qty 1

## 2018-07-05 MED ORDER — PHENOL 1.4 % MT LIQD: 1.0000 | OROMUCOSAL | Status: DC | PRN

## 2018-07-05 MED ORDER — ROCURONIUM BROMIDE 10 MG/ML (PF) SYRINGE
PREFILLED_SYRINGE | INTRAVENOUS | Status: DC | PRN
  Administered 2018-07-05: 20 mg via INTRAVENOUS
  Administered 2018-07-05: 50 mg via INTRAVENOUS
  Administered 2018-07-05: 10 mg via INTRAVENOUS

## 2018-07-05 MED ORDER — THROMBIN 5000 UNITS EX SOLR
CUTANEOUS | Status: DC | PRN
  Administered 2018-07-05 (×2): 5000 [IU] via TOPICAL

## 2018-07-05 MED ORDER — LIDOCAINE 2% (20 MG/ML) 5 ML SYRINGE
INTRAMUSCULAR | Status: DC | PRN
  Administered 2018-07-05: 50 mg via INTRAVENOUS

## 2018-07-05 MED ORDER — DEXAMETHASONE SODIUM PHOSPHATE 10 MG/ML IJ SOLN
INTRAMUSCULAR | Status: AC
  Filled 2018-07-05: qty 1

## 2018-07-05 MED ORDER — THROMBIN 5000 UNITS EX SOLR
CUTANEOUS | Status: AC
  Filled 2018-07-05: qty 15000

## 2018-07-05 MED ORDER — ONDANSETRON HCL 4 MG PO TABS: 4.0000 mg | ORAL_TABLET | Freq: Four times a day (QID) | ORAL | Status: DC | PRN

## 2018-07-05 MED ORDER — HYDROMORPHONE HCL 1 MG/ML IJ SOLN
0.2500 mg | INTRAMUSCULAR | Status: DC | PRN
  Administered 2018-07-05: 0.5 mg via INTRAVENOUS

## 2018-07-05 MED ORDER — FENTANYL CITRATE (PF) 250 MCG/5ML IJ SOLN
INTRAMUSCULAR | Status: AC
  Filled 2018-07-05: qty 5

## 2018-07-05 MED ORDER — OXYCODONE HCL 5 MG PO TABS
ORAL_TABLET | ORAL | Status: AC
  Filled 2018-07-05: qty 1

## 2018-07-05 MED ORDER — METHOCARBAMOL 500 MG PO TABS
ORAL_TABLET | ORAL | Status: AC
  Filled 2018-07-05: qty 1

## 2018-07-05 MED ORDER — SUCCINYLCHOLINE CHLORIDE 20 MG/ML IJ SOLN
INTRAMUSCULAR | Status: DC | PRN
  Administered 2018-07-05: 160 mg via INTRAVENOUS

## 2018-07-05 MED ORDER — SUCCINYLCHOLINE CHLORIDE 200 MG/10ML IV SOSY
PREFILLED_SYRINGE | INTRAVENOUS | Status: AC
  Filled 2018-07-05: qty 10

## 2018-07-05 MED ORDER — SODIUM CHLORIDE 0.9 % IV SOLN: 250.0000 mL | INTRAVENOUS | Status: DC

## 2018-07-05 MED ORDER — POTASSIUM CHLORIDE IN NACL 20-0.9 MEQ/L-% IV SOLN: INTRAVENOUS | Status: DC

## 2018-07-05 MED ORDER — METHOCARBAMOL 1000 MG/10ML IJ SOLN
500.0000 mg | Freq: Four times a day (QID) | INTRAVENOUS | Status: DC | PRN
  Filled 2018-07-05: qty 5

## 2018-07-05 MED ORDER — LIDOCAINE 2% (20 MG/ML) 5 ML SYRINGE
INTRAMUSCULAR | Status: AC
  Filled 2018-07-05: qty 5

## 2018-07-05 MED ORDER — OXYCODONE HCL 5 MG PO TABS
10.0000 mg | ORAL_TABLET | ORAL | Status: DC | PRN
  Administered 2018-07-05 – 2018-07-06 (×3): 10 mg via ORAL
  Filled 2018-07-05 (×3): qty 2

## 2018-07-05 MED ORDER — HYDROMORPHONE HCL 1 MG/ML IJ SOLN
1.0000 mg | INTRAMUSCULAR | Status: DC | PRN
  Administered 2018-07-05: 1 mg via INTRAVENOUS
  Filled 2018-07-05: qty 1

## 2018-07-05 MED ORDER — MONTELUKAST SODIUM 10 MG PO TABS
10.0000 mg | ORAL_TABLET | Freq: Every day | ORAL | Status: DC
  Administered 2018-07-05: 10 mg via ORAL
  Filled 2018-07-05 (×2): qty 1

## 2018-07-05 MED ORDER — ONDANSETRON HCL 4 MG/2ML IJ SOLN: 4.0000 mg | Freq: Four times a day (QID) | INTRAMUSCULAR | Status: DC | PRN

## 2018-07-05 MED ORDER — ACETAMINOPHEN 650 MG RE SUPP: 650.0000 mg | RECTAL | Status: DC | PRN

## 2018-07-05 MED ORDER — LACTATED RINGERS IV SOLN: INTRAVENOUS | Status: DC

## 2018-07-05 SURGICAL SUPPLY — 51 items
BAG DECANTER FOR FLEXI CONT (MISCELLANEOUS) ×3 IMPLANT
BASKET BONE COLLECTION (BASKET) ×3 IMPLANT
BENZOIN TINCTURE PRP APPL 2/3 (GAUZE/BANDAGES/DRESSINGS) ×3 IMPLANT
BIT DRILL 14MM (INSTRUMENTS) ×1 IMPLANT
BUR MATCHSTICK NEURO 3.0 LAGG (BURR) ×3 IMPLANT
CANISTER SUCT 3000ML PPV (MISCELLANEOUS) ×3 IMPLANT
CARTRIDGE OIL MAESTRO DRILL (MISCELLANEOUS) ×1 IMPLANT
CLOSURE WOUND 1/2 X4 (GAUZE/BANDAGES/DRESSINGS) ×1
DIFFUSER DRILL AIR PNEUMATIC (MISCELLANEOUS) ×3 IMPLANT
DRAPE C-ARM 42X72 X-RAY (DRAPES) ×6 IMPLANT
DRAPE LAPAROTOMY 100X72 PEDS (DRAPES) ×3 IMPLANT
DRAPE MICROSCOPE LEICA (MISCELLANEOUS) IMPLANT
DRILL 14MM (INSTRUMENTS) ×3
DRSG OPSITE POSTOP 4X6 (GAUZE/BANDAGES/DRESSINGS) ×6 IMPLANT
DURAPREP 6ML APPLICATOR 50/CS (WOUND CARE) ×3 IMPLANT
ELECT COATED BLADE 2.86 ST (ELECTRODE) ×3 IMPLANT
ELECT REM PT RETURN 9FT ADLT (ELECTROSURGICAL) ×3
ELECTRODE REM PT RTRN 9FT ADLT (ELECTROSURGICAL) ×1 IMPLANT
GAUZE 4X4 16PLY RFD (DISPOSABLE) IMPLANT
GLOVE BIO SURGEON STRL SZ7 (GLOVE) ×3 IMPLANT
GLOVE BIO SURGEON STRL SZ8 (GLOVE) ×3 IMPLANT
GLOVE BIOGEL PI IND STRL 7.0 (GLOVE) ×1 IMPLANT
GLOVE BIOGEL PI INDICATOR 7.0 (GLOVE) ×2
GOWN STRL REUS W/ TWL LRG LVL3 (GOWN DISPOSABLE) ×1 IMPLANT
GOWN STRL REUS W/ TWL XL LVL3 (GOWN DISPOSABLE) ×1 IMPLANT
GOWN STRL REUS W/TWL 2XL LVL3 (GOWN DISPOSABLE) ×3 IMPLANT
GOWN STRL REUS W/TWL LRG LVL3 (GOWN DISPOSABLE) ×2
GOWN STRL REUS W/TWL XL LVL3 (GOWN DISPOSABLE) ×2
HEMOSTAT POWDER KIT SURGIFOAM (HEMOSTASIS) ×3 IMPLANT
KIT BASIN OR (CUSTOM PROCEDURE TRAY) ×3 IMPLANT
KIT TURNOVER KIT B (KITS) ×3 IMPLANT
NEEDLE HYPO 25X1 1.5 SAFETY (NEEDLE) ×3 IMPLANT
NEEDLE SPNL 20GX3.5 QUINCKE YW (NEEDLE) ×3 IMPLANT
NS IRRIG 1000ML POUR BTL (IV SOLUTION) ×3 IMPLANT
OIL CARTRIDGE MAESTRO DRILL (MISCELLANEOUS) ×3
PACK LAMINECTOMY NEURO (CUSTOM PROCEDURE TRAY) ×3 IMPLANT
PAD ARMBOARD 7.5X6 YLW CONV (MISCELLANEOUS) ×9 IMPLANT
PIN DISTRACTION 14MM (PIN) ×6 IMPLANT
PLATE 2 LEVEL 37 (Plate) ×3 IMPLANT
RUBBERBAND STERILE (MISCELLANEOUS) ×6 IMPLANT
SCREW CANN 4X16 SS S/DRILL (Screw) ×18 IMPLANT
SPACER IDENTITI 9X16X14 7D (Spacer) ×3 IMPLANT
SPACER PTI-C 8X16X14 7DEG (Spacer) ×3 IMPLANT
SPONGE INTESTINAL PEANUT (DISPOSABLE) ×6 IMPLANT
SPONGE SURGIFOAM ABS GEL SZ50 (HEMOSTASIS) ×3 IMPLANT
STRIP CLOSURE SKIN 1/2X4 (GAUZE/BANDAGES/DRESSINGS) ×2 IMPLANT
SUT VIC AB 3-0 SH 8-18 (SUTURE) ×6 IMPLANT
SUT VICRYL 4-0 PS2 18IN ABS (SUTURE) ×3 IMPLANT
TOWEL GREEN STERILE (TOWEL DISPOSABLE) ×3 IMPLANT
TOWEL GREEN STERILE FF (TOWEL DISPOSABLE) ×3 IMPLANT
WATER STERILE IRR 1000ML POUR (IV SOLUTION) ×3 IMPLANT

## 2018-07-05 NOTE — Transfer of Care (Signed)
Immediate Anesthesia Transfer of Care Note  Patient: Raymond [name]  Procedure(s) Performed: Anterior Cervical Decompression Fusion, Cervical Five-Six, Six-Seven (N/A )  Patient Location: PACU  Anesthesia Type:General  Level of Consciousness: awake and oriented  Airway & Oxygen Therapy: Patient Spontanous Breathing and Patient connected to nasal cannula oxygen  Post-op Assessment: Report given to RN and Patient moving all extremities X 4  Post vital signs: Reviewed and stable  Last Vitals:  Vitals Value Taken Time  BP 128/74 07/05/2018  4:48 PM  Temp    Pulse 71 07/05/2018  4:48 PM  Resp    SpO2 97 % 07/05/2018  4:48 PM  Vitals shown include unvalidated device data.  Last Pain:  Vitals:   07/05/18 1325  TempSrc:   PainSc: 5       Patients Stated Pain Goal: 4 (07/05/18 1325)  Complications: No apparent anesthesia complications

## 2018-07-05 NOTE — Anesthesia Preprocedure Evaluation (Addendum)
Anesthesia Evaluation  Patient identified by MRN, date of birth, ID band Patient awake    Reviewed: Allergy & Precautions, NPO status , Patient's Chart, lab work & pertinent test results  Airway Mallampati: IV  TM Distance: >3 FB Neck ROM: Full    Dental  (+) Teeth Intact, Dental Advisory Given   Pulmonary    breath sounds clear to auscultation       Cardiovascular negative cardio ROS   Rhythm:Regular Rate:Normal     Neuro/Psych  Headaches, Anxiety    GI/Hepatic Neg liver ROS, GERD  ,  Endo/Other  negative endocrine ROS  Renal/GU negative Renal ROS     Musculoskeletal negative musculoskeletal ROS (+)   Abdominal (+) + obese,   Peds  Hematology negative hematology ROS (+)   Anesthesia Other Findings Bilateral UE weakness with neck flexion and extension  Reproductive/Obstetrics                            Lab Results  Component Value Date   WBC 13.5 (H) 06/26/2018   HGB 14.0 06/26/2018   HCT 43.0 06/26/2018   MCV 93.9 06/26/2018   PLT 303 06/26/2018   Lab Results  Component Value Date   CREATININE 1.05 06/26/2018   BUN 8 06/26/2018   NA 140 06/26/2018   K 3.8 06/26/2018   CL 103 06/26/2018   CO2 27 06/26/2018   Lab Results  Component Value Date   INR 0.99 06/26/2018   INR 1.02 10/10/2014   INR 0.98 02/12/2014   EKG: normal sinus rhythm.  Anesthesia Physical Anesthesia Plan  ASA: III  Anesthesia Plan: General   Post-op Pain Management:    Induction: Intravenous  PONV Risk Score and Plan: 3 and Ondansetron, Dexamethasone and Midazolam  Airway Management Planned: Oral ETT and Video Laryngoscope Planned  Additional Equipment: None  Intra-op Plan:   Post-operative Plan: Extubation in OR  Informed Consent: I have reviewed the patients History and Physical, chart, labs and discussed the procedure including the risks, benefits and alternatives for the proposed  anesthesia with the patient or authorized representative who has indicated his/her understanding and acceptance.   Dental advisory given  Plan Discussed with: CRNA  Anesthesia Plan Comments:        Anesthesia Quick Evaluation

## 2018-07-05 NOTE — Anesthesia Procedure Notes (Signed)
Procedure Name: Intubation Date/Time: 07/05/2018 2:47 PM Performed by: De Nurse, CRNA Pre-anesthesia Checklist: Patient identified, Emergency Drugs available, Suction available and Patient being monitored Patient Re-evaluated:Patient Re-evaluated prior to induction Oxygen Delivery Method: Circle System Utilized Preoxygenation: Pre-oxygenation with 100% oxygen Induction Type: IV induction and Rapid sequence Laryngoscope Size: Glidescope and 3 Grade View: Grade I Tube type: Oral Number of attempts: 1 Airway Equipment and Method: Stylet and Oral airway Placement Confirmation: ETT inserted through vocal cords under direct vision,  positive ETCO2 and breath sounds checked- equal and bilateral Secured at: 23 cm Tube secured with: Tape Dental Injury: Teeth and Oropharynx as per pre-operative assessment  Difficulty Due To: Difficulty was anticipated, Difficult Airway- due to large tongue and Difficult Airway- due to reduced neck mobility Future Recommendations: Recommend- induction with short-acting agent, and alternative techniques readily available

## 2018-07-05 NOTE — Anesthesia Postprocedure Evaluation (Signed)
Anesthesia Post Note  Patient: Raymond [name]  Procedure(s) Performed: Anterior Cervical Decompression Fusion, Cervical Five-Six, Six-Seven (N/A )     Patient location during evaluation: PACU Anesthesia Type: General Level of consciousness: awake and alert Pain management: pain level controlled Vital Signs Assessment: post-procedure vital signs reviewed and stable Respiratory status: spontaneous breathing, nonlabored ventilation, respiratory function stable and patient connected to nasal cannula oxygen Cardiovascular status: blood pressure returned to baseline and stable Postop Assessment: no apparent nausea or vomiting Anesthetic complications: no    Last Vitals:  Vitals:   07/05/18 1748 07/05/18 1803  BP: 126/69 129/84  Pulse: 65 65  Resp: 19 18  Temp:  36.7 C  SpO2: 97% 93%    Last Pain:  Vitals:   07/05/18 1748  TempSrc:   PainSc: Asleep                 Shelton Silvas

## 2018-07-05 NOTE — Op Note (Signed)
07/05/2018  4:46 PM  PATIENT:  Raymond [name]  47 y.o. 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 Decompressive anterior cervical discectomy C5-6 and C6-7, 2. Anterior cervical arthrodesis and C5-6 and C6-7 utilizing a porous titanium interbody cage packed with locally harvested morcellized autologous bone graft, 3. Anterior cervical plating C5-C7 utilizing a Alphatec plate  SURGEON:  Marikay Alar, MD  ASSISTANTS: Verlin Dike FNP  ANESTHESIA:   General  EBL: 50 ml  Total I/O In: 1000 [I.V.:1000] Out: 50 [Blood:50]  BLOOD ADMINISTERED: none  DRAINS: none  SPECIMEN:  none  INDICATION FOR PROCEDURE: This patient presented with pain with numbness and tingling in the arms. Imaging showed severe spinal stenosis 256 and C6-7 with signal change in the spinal cord at C5-6. The patient tried conservative measures without relief. Pain was debilitating. Recommended ACDF with plating. Patient understood the risks, benefits, and alternatives and potential outcomes and wished to proceed.  PROCEDURE DETAILS: Patient was brought to the operating room placed under general endotracheal anesthesia. Patient was placed in the supine position on the operating room table. The neck was prepped with Duraprep and draped in a sterile fashion.   Three cc of local anesthesia was injected and a transverse incision was made on the right side of the neck.  Dissection was carried down thru the subcutaneous tissue and the platysma was  elevated, opened, and undermined with Metzenbaum scissors.  Dissection was then carried out thru an avascular plane leaving the sternocleidomastoid carotid artery and jugular vein laterally and the trachea and esophagus medially. The ventral aspect of the vertebral column was identified and a localizing x-ray was taken. The and C4-5 and C5-6 level was identified. We localize  these 2 levels because of his body habitus. The longus colli muscles were then elevated and the retractor was placed to expose C5-6 and C6-7. The annulus at C5-6 and C6-7 was incised and the disc space entered. Discectomy was performed with micro-curettes and pituitary rongeurs. I then used the high-speed drill to drill the endplates down to the level of the posterior longitudinal ligament. The drill shavings were saved in a mucous trap for later arthrodesis. The operating microscope was draped and brought into the field provided additional magnification, illumination and visualization. Discectomy was continued posteriorly thru the disc space. Posterior longitudinal ligament was opened with a nerve hook, and then removed along with disc herniation and osteophytes, decompressing the spinal canal and thecal sac. We then continued to remove osteophytic overgrowth and disc material decompressing the neural foramina and exiting nerve roots bilaterally. The scope was angled up and down to help decompress and undercut the vertebral bodies. Once the decompression was completed we could pass a nerve hook circumferentially to assure adequate decompression in the midline and in the neural foramina. So by both visualization and palpation we felt we had an adequate decompression of the neural elements. We then measured the height of the intravertebral disc space and selected a 8 millimeter hours titanium interbody cage packed with autograftfor C5-6 and a 9 mm cage at C6-7. It was then gently positioned in the intravertebral disc space(s) and countersunk. I then used a 37 mm Alphatec plate and placed variable angle screws into the vertebral bodies of each level and locked them into position. The wound was irrigated with bacitracin solution, checked for hemostasis which was established and confirmed. Once meticulous hemostasis was achieved, we then proceeded with  closure. The platysma was closed with interrupted 3-0 undyed Vicryl  suture, the subcuticular layer was closed with interrupted 3-0 undyed Vicryl suture. The skin edges were approximated with steristrips. The drapes were removed. A sterile dressing was applied. The patient was then awakened from general anesthesia and transferred to the recovery room in stable condition. At the end of the procedure all sponge, needle and instrument counts were correct.   PLAN OF CARE: Admit for overnight observation  PATIENT DISPOSITION:  PACU - hemodynamically stable.   Delay start of Pharmacological VTE agent (>24hrs) due to surgical blood loss or risk of bleeding:  yes

## 2018-07-05 NOTE — H&P (Signed)
Subjective:   Patient is a 46 y.o. male admitted for spinal stenosis. The patient first presented to me with complaints of neck pain, arm pain and numbness of the arm(s). Onset of symptoms was several months ago. The pain is described as aching and occurs all day. The pain is rated severe, and is located in the neck and radiates to the arms. The symptoms have been progressive. Symptoms are exacerbated by extending head backwards, and are relieved by none.  Previous work up includes MRI of cervical spine, results: spinal stenosis.  Past Medical History:  Diagnosis Date  . Anxiety    panic attack related to pain & also related to decrease ability to breathe through nose    . Headache(784.0)    past 2 yrs. , migraines resolved   . Nasal airway abnormality    2015- seen in [place] ED for decreased ability to breathe through nose, told that he has to get ENT consult     Past Surgical History:  Procedure Laterality Date  . CHOLECYSTECTOMY  2002  . ELBOW ARTHROSCOPY Right 2000   muscle repair   . LUMBAR LAMINECTOMY/DECOMPRESSION MICRODISCECTOMY N/A 02/20/2014   Procedure: LUMBAR TWO/THREE, LUMBAR FOUR/FIVE, LUMBAR FIVE SACRAL ONE LAMINECTOMY WITH RIGHT LUMBAR THREE/FOUR, LUMBAR FOUR/FIVE AND FIVE SACRAL ONE MICRODISECTOMY;  Surgeon: Tia Alert, MD;  Location: [facility];  Service: Neurosurgery;  Laterality: N/A;  . LUMBAR LAMINECTOMY/DECOMPRESSION MICRODISCECTOMY Right 10/15/2014   Procedure: LUMBAR FOUR TO FIVE LAMINECTOMY/DECOMPRESSION MICRODISCECTOMY RIGHT 1 LEVEL;  Surgeon: Tia Alert, MD;  Location: [facility];  Service: Neurosurgery;  Laterality: Right;    Allergies  Allergen Reactions  . Gadolinium Derivatives Shortness Of Breath, Photosensitivity, Nausea And Vomiting, Swelling, Cough and Hypertension    PT HAVING NAUSEA AND VOMITING, COUGH, SHORTNESS OF BREATH, SEVERE HEADACHES, BILATERAL EYELIDS WERE SWOLLEN, HYPERTENSION, PT WAS TAKEN TO HOSPITAL VIA EMS FOR FURTHER  EVALUATION.     Social History   Tobacco Use  . Smoking status: Never Smoker  . Smokeless tobacco: Never Used  Substance Use Topics  . Alcohol use: No    History reviewed. No pertinent family history. Prior to Admission medications   Medication Sig Start Date End Date Taking? Authorizing Provider  escitalopram (LEXAPRO) 20 MG tablet Take 20 mg by mouth daily. 05/27/18  Yes [provider]  methocarbamol (ROBAXIN) 750 MG tablet Take 750 mg by mouth daily as needed for muscle spasms. 05/16/18  Yes [provider]  metoprolol succinate (TOPROL-XL) 100 MG 24 hr tablet Take 100 mg by mouth daily. 06/10/18  Yes [provider]  montelukast (SINGULAIR) 10 MG tablet Take 10 mg by mouth daily. 05/26/18  Yes [provider]  phentermine (ADIPEX-P) 37.5 MG tablet Take 37.5 mg by mouth daily. 06/09/18  Yes [provider]  diphenhydrAMINE (BENADRYL) 25 MG tablet Take 1 tablet (25 mg total) by mouth every 6 (six) hours. Patient not taking: Reported on 06/22/2018 04/08/15   Azalia Bilis, MD  famotidine (PEPCID) 20 MG tablet Take 1 tablet (20 mg total) by mouth 2 (two) times daily. Patient not taking: Reported on 06/22/2018 04/08/15   Azalia Bilis, MD  methocarbamol (ROBAXIN) 500 MG tablet Take 1 tablet (500 mg total) by mouth every 6 (six) hours as needed for muscle spasms. Patient not taking: Reported on 06/22/2018 02/21/14   Tia Alert, MD  oxyCODONE-acetaminophen (PERCOCET/ROXICET) 5-325 MG per tablet Take 1-2 tablets by mouth every 4 (four) hours as needed for moderate pain. Patient not taking:  Reported on 04/08/2015 02/21/14   Tia Alert, MD     Review of Systems  Positive ROS: neg  All other systems have been reviewed and were otherwise negative with the exception of those mentioned in the HPI and as above.  Objective: Vital signs in last 24 hours:    General Appearance: Alert, cooperative, no distress, appears stated age Head: Normocephalic,  without obvious abnormality, atraumatic Eyes: PERRL, conjunctiva/corneas clear, EOM's intact      Neck: Supple, symmetrical, trachea midline, Back: Symmetric, no curvature, ROM normal, no CVA tenderness Lungs:  respirations unlabored Heart: Regular rate and rhythm Abdomen: Soft, non-tender Extremities: Extremities normal, atraumatic, no cyanosis or edema Pulses: 2+ and symmetric all extremities Skin: Skin color, texture, turgor normal, no rashes or lesions  NEUROLOGIC:  Mental status: Alert and oriented x4, no aphasia, good attention span, fund of knowledge and memory  Motor Exam - grossly normal Sensory Exam - grossly normal Reflexes: 2+ Coordination - grossly normal Gait - grossly normal Balance - grossly normal Cranial Nerves: I: smell Not tested  II: visual acuity  OS: nl    OD: nl  II: visual fields Full to confrontation  II: pupils Equal, round, reactive to light  III,VII: ptosis None  III,IV,VI: extraocular muscles  Full ROM  V: mastication Normal  V: facial light touch sensation  Normal  V,VII: corneal reflex  Present  VII: facial muscle function - upper  Normal  VII: facial muscle function - lower Normal  VIII: hearing Not tested  IX: soft palate elevation  Normal  IX,X: gag reflex Present  XI: trapezius strength  5/5  XI: sternocleidomastoid strength 5/5  XI: neck flexion strength  5/5  XII: tongue strength  Normal    Data Review Lab Results  Component Value Date   WBC 13.5 (H) 06/26/2018   HGB 14.0 06/26/2018   HCT 43.0 06/26/2018   MCV 93.9 06/26/2018   PLT 303 06/26/2018   Lab Results  Component Value Date   NA 140 06/26/2018   K 3.8 06/26/2018   CL 103 06/26/2018   CO2 27 06/26/2018   BUN 8 06/26/2018   CREATININE 1.05 06/26/2018   GLUCOSE 80 06/26/2018   Lab Results  Component Value Date   INR 0.99 06/26/2018    Assessment:   Cervical neck pain with herniated nucleus pulposus/ spondylosis/ stenosis at c5-6 C6-7. Estimated body mass  index is 42.35 kg/m as calculated from the following:   Height as of 06/26/18: 6\' 1"  (1.854 m).   Weight as of 06/26/18: 145.6 kg.  Patient has failed conservative therapy. Planned surgery : acdf C5-6 C6-7  Plan:   I explained the condition and procedure to the patient and answered any questions.  Patient wishes to proceed with procedure as planned. Understands risks/ benefits/ and expected or typical outcomes.  Chevon Laufer S 07/05/2018 10:56 AM

## 2018-07-06 ENCOUNTER — Encounter (HOSPITAL_COMMUNITY): Payer: Self-pay | Admitting: Neurological Surgery

## 2018-07-06 DIAGNOSIS — M4712 Other spondylosis with myelopathy, cervical region: Secondary | ICD-10-CM | POA: Diagnosis not present

## 2018-07-06 MED ORDER — METHOCARBAMOL 500 MG PO TABS: 500.0000 mg | ORAL_TABLET | Freq: Four times a day (QID) | ORAL | 0 refills | Status: AC | PRN

## 2018-07-06 MED ORDER — OXYCODONE HCL 5 MG PO TABS: 5.0000 mg | ORAL_TABLET | Freq: Four times a day (QID) | ORAL | 0 refills | Status: AC | PRN

## 2018-07-06 NOTE — Discharge Summary (Signed)
Physician Discharge Summary  Patient ID: Raymond [name] MRN: [id number] DOB/AGE: [birth year] 47 y.o.  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 The patient tolerated the procedure well and was taken to the recovery room and then to the floor in stable condition. The hospital course was routine. There were no complications. The wound remained clean dry and intact. Pt had appropriate neck soreness. No complaints of arm pain or new N/T/W. The patient remained afebrile with stable vital signs, and tolerated a regular diet. The patient continued to increase activities, and pain was well controlled with oral pain medications.   Consults: None  Significant Diagnostic Studies:  Results for orders placed or performed during the hospital encounter of 06/26/18  Surgical pcr screen  Result Value Ref Range   MRSA, PCR NEGATIVE NEGATIVE   Staphylococcus aureus POSITIVE (A) NEGATIVE  Basic metabolic panel  Result Value Ref Range   Sodium 140 135 - 145 mmol/L   Potassium 3.8 3.5 - 5.1 mmol/L   Chloride 103 98 - 111 mmol/L   CO2 27 22 - 32 mmol/L   Glucose, Bld 80 70 - 99 mg/dL   BUN 8 6 - 20 mg/dL   Creatinine, Ser 9.48 0.61 - 1.24 mg/dL   Calcium 9.0 8.9 - 01.6 mg/dL   GFR calc non Af Amer >60 >60 mL/min   GFR calc Af Amer >60 >60 mL/min   Anion gap 10 5 - 15  CBC WITH DIFFERENTIAL  Result Value Ref Range   WBC 13.5 (H) 4.0 - 10.5 K/uL   RBC 4.58 4.22 - 5.81 MIL/uL   Hemoglobin 14.0 13.0 - 17.0 g/dL   HCT 55.3 74.8 - 27.0 %   MCV 93.9 78.0 - 100.0 fL   MCH 30.6 26.0 - 34.0 pg   MCHC 32.6 30.0 - 36.0 g/dL   RDW 78.6 75.4 - 49.2 %   Platelets 303 150 - 400 K/uL   Neutrophils Relative % 55 %   Neutro Abs  7.6 1.7 - 7.7 K/uL   Lymphocytes Relative 30 %   Lymphs Abs 4.0 0.7 - 4.0 K/uL   Monocytes Relative 10 %   Monocytes Absolute 1.4 (H) 0.1 - 1.0 K/uL   Eosinophils Relative 3 %   Eosinophils Absolute 0.4 0.0 - 0.7 K/uL   Basophils Relative 1 %   Basophils Absolute 0.1 0.0 - 0.1 K/uL   Immature Granulocytes 1 %   Abs Immature Granulocytes 0.1 0.0 - 0.1 K/uL  Protime-INR  Result Value Ref Range   Prothrombin Time 13.0 11.4 - 15.2 seconds   INR 0.99     Chest 2 View  Result Date: 06/27/2018 CLINICAL DATA:  47 year old male preoperative study for cervical spine surgery. Hypertension. EXAM: CHEST - 2 VIEW COMPARISON:  Chest radiographs 02/12/2014. FINDINGS: Stable somewhat low lung volumes. Mediastinal contours remain normal. Visualized tracheal air column is within normal limits. Both lungs are clear. No pneumothorax or pleural effusion. No acute osseous abnormality identified. Negative visible bowel gas pattern. IMPRESSION: Negative.  No cardiopulmonary abnormality. Electronically Signed   By: Odessa Fleming M.D.   On: 06/27/2018 09:03   Dg Cervical Spine 2-3 Views  Result Date: 07/05/2018 CLINICAL DATA:  ACDF of C5-6, C6-7. EXAM: DG C-ARM 61-120 MIN; CERVICAL SPINE -  2-3 VIEW COMPARISON:  None. FINDINGS: Two lateral intraoperative fluoroscopic views of the cervical spine are provided. Anterior cervical fusion hardware with intervening disc grafts appear appropriately positioned at the C5-6 and C6-7 levels. Hardware appears intact. Fluoroscopy was provided for 14 seconds. IMPRESSION: Intraoperative fluoroscopic images demonstrating anterior cervical fusion hardware at the C5-6 and C6-7 levels. No evidence of surgical complicating feature. Electronically Signed   By: Bary Richard M.D.   On: 07/05/2018 17:21   Dg C-arm 1-60 Min  Result Date: 07/05/2018 CLINICAL DATA:  ACDF of C5-6, C6-7. EXAM: DG C-ARM 61-120 MIN; CERVICAL SPINE - 2-3 VIEW COMPARISON:  None. FINDINGS: Two lateral intraoperative  fluoroscopic views of the cervical spine are provided. Anterior cervical fusion hardware with intervening disc grafts appear appropriately positioned at the C5-6 and C6-7 levels. Hardware appears intact. Fluoroscopy was provided for 14 seconds. IMPRESSION: Intraoperative fluoroscopic images demonstrating anterior cervical fusion hardware at the C5-6 and C6-7 levels. No evidence of surgical complicating feature. Electronically Signed   By: Bary Richard M.D.   On: 07/05/2018 17:21    Antibiotics:  Anti-infectives (From admission, onward)   Start     Dose/Rate Route Frequency Ordered Stop   07/05/18 2300  ceFAZolin (ANCEF) IVPB 2g/100 mL premix     2 g 200 mL/hr over 30 Minutes Intravenous Every 8 hours 07/05/18 1818 07/06/18 0629   07/05/18 1528  bacitracin 50,000 Units in sodium chloride 0.9 % 500 mL irrigation  Status:  Discontinued       As needed 07/05/18 1529 07/05/18 1644   07/05/18 1200  ceFAZolin (ANCEF) 3 g in dextrose 5 % 50 mL IVPB     3 g 100 mL/hr over 30 Minutes Intravenous To Short Stay 07/04/18 1048 07/05/18 1449      Discharge Exam: Blood pressure 130/79, pulse 89, temperature 98.5 F (36.9 C), temperature source Oral, resp. rate 17, height 6\' 1"  (1.854 m), weight (!) 145.6 kg, SpO2 95 %. Neurologic: Grossly normal Ambulating and voiding well  Discharge Medications:   Allergies as of 07/06/2018      Reactions   Gadolinium Derivatives Shortness Of Breath, Photosensitivity, Nausea And Vomiting, Swelling, Cough, Hypertension   PT HAVING NAUSEA AND VOMITING, COUGH, SHORTNESS OF BREATH, SEVERE HEADACHES, BILATERAL EYELIDS WERE SWOLLEN, HYPERTENSION, PT WAS TAKEN TO HOSPITAL VIA EMS FOR FURTHER EVALUATION.    Sulfa Antibiotics Nausea And Vomiting      Medication List    TAKE these medications   diphenhydrAMINE 25 MG tablet Commonly known as:  BENADRYL Take 1 tablet (25 mg total) by mouth every 6 (six) hours.   escitalopram 20 MG tablet Commonly known as:  LEXAPRO Take  20 mg by mouth daily.   famotidine 20 MG tablet Commonly known as:  PEPCID Take 1 tablet (20 mg total) by mouth 2 (two) times daily.   methocarbamol 500 MG tablet Commonly known as:  ROBAXIN Take 1 tablet (500 mg total) by mouth every 6 (six) hours as needed for muscle spasms. What changed:  Another medication with the same name was added. Make sure you understand how and when to take each.   methocarbamol 750 MG tablet Commonly known as:  ROBAXIN Take 750 mg by mouth daily as needed for muscle spasms. What changed:  Another medication with the same name was added. Make sure you understand how and when to take each.   methocarbamol 500 MG tablet Commonly known as:  ROBAXIN Take 1 tablet (500 mg total) by mouth every 6 (six) hours as  needed for muscle spasms. What changed:  You were already taking a medication with the same name, and this prescription was added. Make sure you understand how and when to take each.   metoprolol succinate 100 MG 24 hr tablet Commonly known as:  TOPROL-XL Take 100 mg by mouth daily.   montelukast 10 MG tablet Commonly known as:  SINGULAIR Take 10 mg by mouth daily.   oxyCODONE 5 MG immediate release tablet Commonly known as:  Oxy IR/ROXICODONE Take 1 tablet (5 mg total) by mouth every 6 (six) hours as needed for moderate pain ((score 4 to 6)).   oxyCODONE-acetaminophen 5-325 MG tablet Commonly known as:  PERCOCET/ROXICET Take 1-2 tablets by mouth every 4 (four) hours as needed for moderate pain.   phentermine 37.5 MG tablet Commonly known as:  ADIPEX-P Take 37.5 mg by mouth daily.       Disposition: home   Final Dx: acdf c5-6, 6-7  Discharge Instructions     Remove dressing in 72 hours   Complete by:  As directed    Call MD for:  difficulty breathing, headache or visual disturbances   Complete by:  As directed    Call MD for:  hives   Complete by:  As directed    Call MD for:  persistant dizziness or light-headedness   Complete by:   As directed    Call MD for:  persistant nausea and vomiting   Complete by:  As directed    Call MD for:  redness, tenderness, or signs of infection (pain, swelling, redness, odor or green/yellow discharge around incision site)   Complete by:  As directed    Call MD for:  severe uncontrolled pain   Complete by:  As directed    Call MD for:  temperature >100.4   Complete by:  As directed    Diet - low sodium heart healthy   Complete by:  As directed    Driving Restrictions   Complete by:  As directed    No driving 2 weeks   Increase activity slowly   Complete by:  As directed    Lifting restrictions   Complete by:  As directed    No lifting more than 8 lbs      Follow-up Information    Tia Alert, MD. Schedule an appointment as soon as possible for a visit in 2 week(s).   Specialty:  Neurosurgery Contact information: [address] [place] [address] [phone]            Signed: Tiana Loft [facility] 07/06/2018, 7:42 AM

## 2018-07-06 NOTE — Progress Notes (Signed)
Patient alert and oriented, mae's well, voiding adequate amount of urine, swallowing without difficulty, no c/o pain at time of discharge. Patient discharged home with family. Script and discharged instructions given to patient. Patient and family stated understanding of instructions given. Patient has an appointment with Dr. Jones °

## 2018-08-22 ENCOUNTER — Other Ambulatory Visit: Payer: Self-pay | Admitting: Neurological Surgery

## 2018-08-22 DIAGNOSIS — M5412 Radiculopathy, cervical region: Secondary | ICD-10-CM

## 2018-08-29 ENCOUNTER — Ambulatory Visit
Admission: RE | Admit: 2018-08-29 | Discharge: 2018-08-29 | Disposition: A | Discharge status: Home / Self Care | Payer: BLUE CROSS/BLUE SHIELD | Source: Ambulatory Visit | Attending: Neurological Surgery | Admitting: Neurological Surgery

## 2018-08-29 DIAGNOSIS — M5412 Radiculopathy, cervical region: Secondary | ICD-10-CM

## 2018-09-18 ENCOUNTER — Other Ambulatory Visit: Payer: Self-pay | Admitting: Neurological Surgery

## 2018-09-18 DIAGNOSIS — M5412 Radiculopathy, cervical region: Secondary | ICD-10-CM

## 2018-09-30 ENCOUNTER — Other Ambulatory Visit: Payer: Medicare Other

## 2018-09-30 ENCOUNTER — Ambulatory Visit
Admission: RE | Admit: 2018-09-30 | Discharge: 2018-09-30 | Disposition: A | Discharge status: Home / Self Care | Payer: Medicare Other | Source: Ambulatory Visit | Attending: Neurological Surgery | Admitting: Neurological Surgery

## 2018-09-30 DIAGNOSIS — M5412 Radiculopathy, cervical region: Secondary | ICD-10-CM

## 2018-10-29 DIAGNOSIS — R51 Headache: Secondary | ICD-10-CM

## 2018-10-29 DIAGNOSIS — E785 Hyperlipidemia, unspecified: Secondary | ICD-10-CM | POA: Insufficient documentation

## 2018-10-29 DIAGNOSIS — G4730 Sleep apnea, unspecified: Secondary | ICD-10-CM | POA: Insufficient documentation

## 2018-10-29 DIAGNOSIS — E669 Obesity, unspecified: Secondary | ICD-10-CM | POA: Insufficient documentation

## 2018-10-29 DIAGNOSIS — R519 Headache, unspecified: Secondary | ICD-10-CM | POA: Insufficient documentation

## 2018-10-29 HISTORY — DX: Sleep apnea, unspecified: G47.30

## 2018-11-01 ENCOUNTER — Other Ambulatory Visit: Payer: Self-pay | Admitting: Neurological Surgery

## 2018-11-01 DIAGNOSIS — M4802 Spinal stenosis, cervical region: Secondary | ICD-10-CM

## 2018-11-08 ENCOUNTER — Ambulatory Visit
Admission: RE | Admit: 2018-11-08 | Discharge: 2018-11-08 | Disposition: A | Discharge status: Home / Self Care | Payer: Medicare Other | Source: Ambulatory Visit | Attending: Neurological Surgery | Admitting: Neurological Surgery

## 2018-11-08 DIAGNOSIS — M4802 Spinal stenosis, cervical region: Secondary | ICD-10-CM

## 2018-11-08 HISTORY — DX: Sleep apnea, unspecified: G47.30

## 2018-11-08 MED ORDER — IOPAMIDOL (ISOVUE-M 300) INJECTION 61%
1.0000 mL | Freq: Once | INTRAMUSCULAR | Status: AC | PRN
  Administered 2018-11-08: 1 mL via EPIDURAL

## 2018-11-08 MED ORDER — DIAZEPAM 5 MG PO TABS
10.0000 mg | ORAL_TABLET | Freq: Once | ORAL | Status: AC
  Administered 2018-11-08: 10 mg via ORAL

## 2018-11-08 MED ORDER — TRIAMCINOLONE ACETONIDE 40 MG/ML IJ SUSP (RADIOLOGY)
60.0000 mg | Freq: Once | INTRAMUSCULAR | Status: AC
  Administered 2018-11-08: 60 mg via EPIDURAL

## 2018-11-08 NOTE — Discharge Instructions (Signed)

## 2018-12-11 ENCOUNTER — Other Ambulatory Visit: Payer: Self-pay | Admitting: Student

## 2018-12-11 DIAGNOSIS — M5412 Radiculopathy, cervical region: Secondary | ICD-10-CM

## 2018-12-18 ENCOUNTER — Ambulatory Visit
Admission: RE | Admit: 2018-12-18 | Discharge: 2018-12-18 | Disposition: A | Discharge status: Home / Self Care | Payer: Medicare Other | Source: Ambulatory Visit | Attending: Student | Admitting: Student

## 2018-12-18 VITALS — BP 144/90 | HR 71

## 2018-12-18 DIAGNOSIS — M4322 Fusion of spine, cervical region: Secondary | ICD-10-CM

## 2018-12-18 DIAGNOSIS — M5412 Radiculopathy, cervical region: Secondary | ICD-10-CM

## 2018-12-18 DIAGNOSIS — M4802 Spinal stenosis, cervical region: Secondary | ICD-10-CM

## 2018-12-18 MED ORDER — IOPAMIDOL (ISOVUE-M 300) INJECTION 61%
1.0000 mL | Freq: Once | INTRAMUSCULAR | Status: AC | PRN
  Administered 2018-12-18: 1 mL via EPIDURAL

## 2018-12-18 MED ORDER — TRIAMCINOLONE ACETONIDE 40 MG/ML IJ SUSP (RADIOLOGY)
60.0000 mg | Freq: Once | INTRAMUSCULAR | Status: AC
  Administered 2018-12-18: 60 mg via EPIDURAL

## 2018-12-18 MED ORDER — DIAZEPAM 5 MG PO TABS
10.0000 mg | ORAL_TABLET | Freq: Once | ORAL | Status: AC
  Administered 2018-12-18: 10 mg via ORAL

## 2018-12-18 NOTE — Discharge Instructions (Signed)

## 2019-03-10 IMAGING — XA DG INJECT/[PERSON_NAME] INC NEEDLE/CATH/PLC EPI/CERV/THOR W/IMG
2 series · 2 of 2 positions shown · non-contrast
Comparison: none

CLINICAL DATA: Cervical spondylosis without myelopathy. LEFT arm
pain. Prior cervical fusion.

[Series 1: ortho adipose · 1 of 1 slices shown (1 of 2)]
[im 1/1]
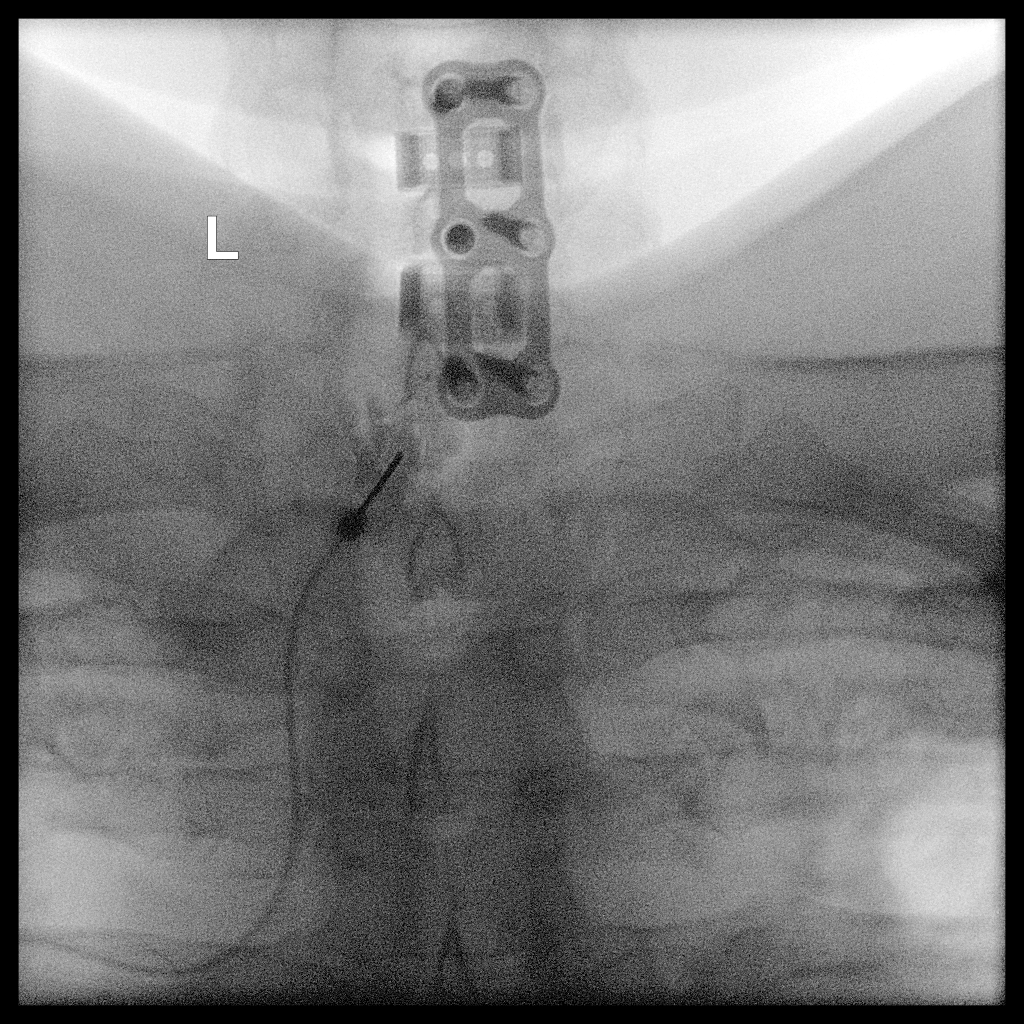

[Series 2: ortho adipose · 1 of 1 slices shown (2 of 2)]
[im 1/1]
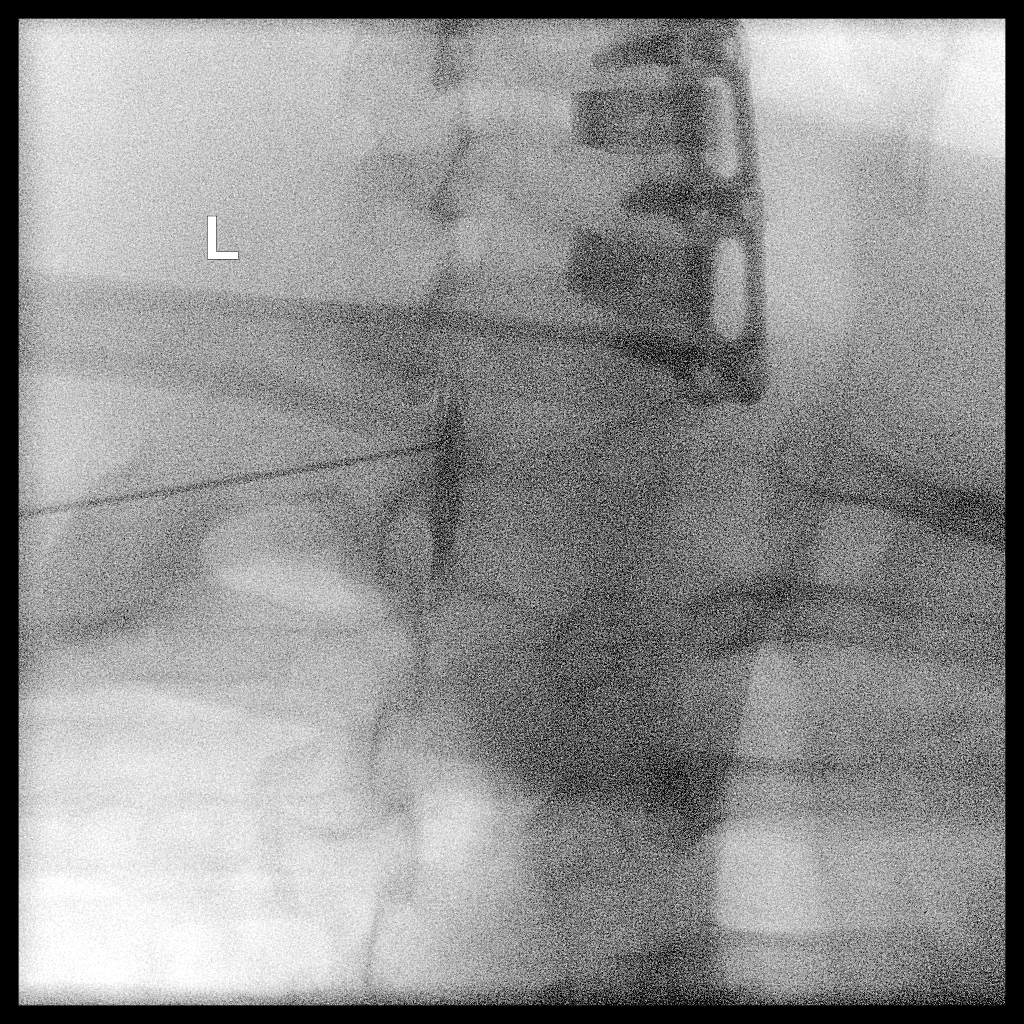

[2 of 2 positions shown; findings below may reference images not displayed]

FLUOROSCOPY TIME:  11 seconds corresponding to a Dose Area Product
of 35.99 Gy*m2

PROCEDURE:
Informed written consent was obtained.  Time-out was performed.

An appropriate skin entry site was chosen, cleansed with Betadine,
and anesthetized with 1% lidocaine.

CERVICOTHORACIC EPIDURAL INJECTION

The interlaminar space at C7-T1 could not be visualized because of
the ACDF plate. Therefore the level below was chosen.

Interlaminar approach was performed on the LEFT at T1-T2. A 20 gauge
epidural needle was advanced using loss-of-resistance technique.

DIAGNOSTIC EPIDURAL INJECTION

Injection of Isovue-M 300 shows a good epidural pattern with spread
above and below the level of needle placement, primarily on the
LEFT. No vascular opacification is seen.

THERAPEUTIC EPIDURAL INJECTION

1.5 ml of Kenalog 40 mixed with 1 ml of 1% Lidocaine and 2 ml of
normal saline were then instilled. The procedure was well-tolerated,
and the patient was discharged thirty minutes following the
injection in good condition.
IMPRESSION: Technically successful first epidural injection on the LEFT at
T1-T2.

## 2019-07-09 ENCOUNTER — Other Ambulatory Visit: Payer: Self-pay | Admitting: Neurological Surgery

## 2019-07-09 DIAGNOSIS — M5412 Radiculopathy, cervical region: Secondary | ICD-10-CM

## 2019-07-15 ENCOUNTER — Ambulatory Visit
Admission: RE | Admit: 2019-07-15 | Discharge: 2019-07-15 | Disposition: A | Discharge status: Home / Self Care | Payer: Medicare Other | Source: Ambulatory Visit | Attending: Neurological Surgery | Admitting: Neurological Surgery

## 2019-07-15 DIAGNOSIS — M5412 Radiculopathy, cervical region: Secondary | ICD-10-CM

## 2020-05-19 ENCOUNTER — Other Ambulatory Visit: Payer: Self-pay | Admitting: Student

## 2020-05-19 DIAGNOSIS — M5412 Radiculopathy, cervical region: Secondary | ICD-10-CM

## 2020-06-13 ENCOUNTER — Ambulatory Visit
Admission: RE | Admit: 2020-06-13 | Discharge: 2020-06-13 | Disposition: A | Discharge status: Home / Self Care | Payer: Medicare Other | Source: Ambulatory Visit | Attending: Student | Admitting: Student

## 2020-06-13 DIAGNOSIS — M5412 Radiculopathy, cervical region: Secondary | ICD-10-CM

## 2022-06-28 ENCOUNTER — Other Ambulatory Visit: Payer: Self-pay | Admitting: Nurse Practitioner

## 2022-06-28 DIAGNOSIS — M5417 Radiculopathy, lumbosacral region: Secondary | ICD-10-CM

## 2022-06-28 DIAGNOSIS — M542 Cervicalgia: Secondary | ICD-10-CM

## 2022-07-16 ENCOUNTER — Ambulatory Visit
Admission: RE | Admit: 2022-07-16 | Discharge: 2022-07-16 | Disposition: A | Discharge status: Home / Self Care | Payer: Medicare Other | Source: Ambulatory Visit | Attending: Nurse Practitioner | Admitting: Nurse Practitioner

## 2022-07-16 DIAGNOSIS — M542 Cervicalgia: Secondary | ICD-10-CM

## 2022-07-16 DIAGNOSIS — M5417 Radiculopathy, lumbosacral region: Secondary | ICD-10-CM
